# Patient Record
Sex: Male | Born: 1955 | Race: White | Hispanic: No | Smoking: Never smoker
Health system: Southern US, Community
[De-identification: ages and names within clinical notes are randomized; demographics above are authoritative.]

## PROBLEM LIST (undated history)

## (undated) DIAGNOSIS — I2510 Atherosclerotic heart disease of native coronary artery without angina pectoris: Secondary | ICD-10-CM

## (undated) DIAGNOSIS — N189 Chronic kidney disease, unspecified: Secondary | ICD-10-CM

## (undated) DIAGNOSIS — K222 Esophageal obstruction: Secondary | ICD-10-CM

## (undated) DIAGNOSIS — F329 Major depressive disorder, single episode, unspecified: Secondary | ICD-10-CM

## (undated) DIAGNOSIS — F419 Anxiety disorder, unspecified: Secondary | ICD-10-CM

## (undated) DIAGNOSIS — G8220 Paraplegia, unspecified: Secondary | ICD-10-CM

## (undated) DIAGNOSIS — Z8719 Personal history of other diseases of the digestive system: Secondary | ICD-10-CM

## (undated) DIAGNOSIS — F32A Depression, unspecified: Secondary | ICD-10-CM

## (undated) DIAGNOSIS — I219 Acute myocardial infarction, unspecified: Secondary | ICD-10-CM

## (undated) DIAGNOSIS — Z8614 Personal history of Methicillin resistant Staphylococcus aureus infection: Secondary | ICD-10-CM

## (undated) DIAGNOSIS — K219 Gastro-esophageal reflux disease without esophagitis: Secondary | ICD-10-CM

## (undated) DIAGNOSIS — L8990 Pressure ulcer of unspecified site, unspecified stage: Secondary | ICD-10-CM

## (undated) DIAGNOSIS — I10 Essential (primary) hypertension: Secondary | ICD-10-CM

## (undated) DIAGNOSIS — E785 Hyperlipidemia, unspecified: Secondary | ICD-10-CM

## (undated) HISTORY — PX: TOE AMPUTATION: SHX809

## (undated) HISTORY — DX: Gastro-esophageal reflux disease without esophagitis: K21.9

## (undated) HISTORY — DX: Major depressive disorder, single episode, unspecified: F32.9

## (undated) HISTORY — DX: Hyperlipidemia, unspecified: E78.5

## (undated) HISTORY — PX: COLOSTOMY: SHX63

## (undated) HISTORY — DX: Anxiety disorder, unspecified: F41.9

## (undated) HISTORY — DX: Pressure ulcer of unspecified site, unspecified stage: L89.90

## (undated) HISTORY — DX: Paraplegia, unspecified: G82.20

## (undated) HISTORY — DX: Depression, unspecified: F32.A

## (undated) HISTORY — DX: Atherosclerotic heart disease of native coronary artery without angina pectoris: I25.10

## (undated) HISTORY — DX: Personal history of other diseases of the digestive system: Z87.19

## (undated) HISTORY — DX: Personal history of Methicillin resistant Staphylococcus aureus infection: Z86.14

## (undated) HISTORY — DX: Chronic kidney disease, unspecified: N18.9

## (undated) HISTORY — DX: Esophageal obstruction: K22.2

## (undated) HISTORY — PX: ANKLE SURGERY: SHX546

## (undated) HISTORY — PX: EMBOLIZATION: SHX1496

## (undated) HISTORY — DX: Essential (primary) hypertension: I10

## (undated) HISTORY — PX: JOINT REPLACEMENT: SHX530

## (undated) HISTORY — DX: Acute myocardial infarction, unspecified: I21.9

## (undated) HISTORY — PX: OTHER SURGICAL HISTORY: SHX169

---

## 2003-12-11 ENCOUNTER — Emergency Department (HOSPITAL_COMMUNITY): Admission: EM | Admit: 2003-12-11 | Discharge: 2003-12-11 | Payer: Self-pay | Admitting: Emergency Medicine

## 2004-01-13 ENCOUNTER — Other Ambulatory Visit: Payer: Self-pay

## 2004-02-18 ENCOUNTER — Encounter: Payer: Self-pay | Admitting: Internal Medicine

## 2004-03-16 ENCOUNTER — Ambulatory Visit: Payer: Self-pay | Admitting: Internal Medicine

## 2004-03-20 ENCOUNTER — Encounter: Payer: Self-pay | Admitting: Internal Medicine

## 2004-03-20 ENCOUNTER — Ambulatory Visit: Payer: Self-pay | Admitting: Internal Medicine

## 2004-03-20 ENCOUNTER — Ambulatory Visit: Payer: Self-pay | Admitting: Infectious Diseases

## 2004-03-20 ENCOUNTER — Inpatient Hospital Stay (HOSPITAL_COMMUNITY): Admission: AD | Admit: 2004-03-20 | Discharge: 2004-04-18 | Payer: Self-pay | Admitting: Neurological Surgery

## 2004-03-20 ENCOUNTER — Ambulatory Visit: Payer: Self-pay | Admitting: Physical Medicine & Rehabilitation

## 2004-04-19 ENCOUNTER — Encounter: Payer: Self-pay | Admitting: Internal Medicine

## 2004-05-17 ENCOUNTER — Ambulatory Visit (HOSPITAL_COMMUNITY): Admission: RE | Admit: 2004-05-17 | Discharge: 2004-05-17 | Payer: Self-pay | Admitting: Internal Medicine

## 2004-05-20 ENCOUNTER — Encounter: Payer: Self-pay | Admitting: Internal Medicine

## 2004-06-20 ENCOUNTER — Encounter: Payer: Self-pay | Admitting: Internal Medicine

## 2004-07-18 ENCOUNTER — Encounter: Payer: Self-pay | Admitting: Internal Medicine

## 2004-08-18 ENCOUNTER — Encounter: Payer: Self-pay | Admitting: Internal Medicine

## 2004-09-03 ENCOUNTER — Ambulatory Visit (HOSPITAL_COMMUNITY): Admission: RE | Admit: 2004-09-03 | Discharge: 2004-09-04 | Payer: Self-pay | Admitting: Internal Medicine

## 2004-09-17 ENCOUNTER — Encounter: Payer: Self-pay | Admitting: Internal Medicine

## 2004-10-18 ENCOUNTER — Encounter: Payer: Self-pay | Admitting: Internal Medicine

## 2004-11-17 ENCOUNTER — Encounter: Payer: Self-pay | Admitting: Internal Medicine

## 2004-12-18 ENCOUNTER — Encounter: Payer: Self-pay | Admitting: Internal Medicine

## 2005-01-18 ENCOUNTER — Encounter: Payer: Self-pay | Admitting: Internal Medicine

## 2005-02-17 ENCOUNTER — Encounter: Payer: Self-pay | Admitting: Internal Medicine

## 2005-03-20 ENCOUNTER — Encounter: Payer: Self-pay | Admitting: Internal Medicine

## 2005-03-22 ENCOUNTER — Ambulatory Visit: Payer: Self-pay | Admitting: Vascular Surgery

## 2005-04-04 ENCOUNTER — Ambulatory Visit: Payer: Self-pay | Admitting: Specialist

## 2005-04-19 ENCOUNTER — Encounter: Payer: Self-pay | Admitting: Internal Medicine

## 2005-05-20 ENCOUNTER — Encounter: Payer: Self-pay | Admitting: Internal Medicine

## 2005-05-31 ENCOUNTER — Ambulatory Visit: Payer: Self-pay | Admitting: Vascular Surgery

## 2005-06-19 ENCOUNTER — Other Ambulatory Visit: Payer: Self-pay

## 2005-06-20 ENCOUNTER — Encounter: Payer: Self-pay | Admitting: Internal Medicine

## 2005-06-20 ENCOUNTER — Inpatient Hospital Stay: Payer: Self-pay | Admitting: Internal Medicine

## 2005-07-01 ENCOUNTER — Ambulatory Visit: Payer: Self-pay | Admitting: Orthopaedic Surgery

## 2005-07-18 ENCOUNTER — Encounter: Payer: Self-pay | Admitting: Internal Medicine

## 2005-08-18 ENCOUNTER — Encounter: Payer: Self-pay | Admitting: Internal Medicine

## 2005-10-03 ENCOUNTER — Inpatient Hospital Stay: Payer: Self-pay | Admitting: Internal Medicine

## 2005-10-16 ENCOUNTER — Encounter: Payer: Self-pay | Admitting: Internal Medicine

## 2005-10-18 ENCOUNTER — Encounter: Payer: Self-pay | Admitting: Internal Medicine

## 2005-11-04 ENCOUNTER — Ambulatory Visit: Payer: Self-pay | Admitting: Orthopedic Surgery

## 2005-11-11 ENCOUNTER — Ambulatory Visit: Payer: Self-pay | Admitting: Orthopedic Surgery

## 2005-11-17 ENCOUNTER — Encounter: Payer: Self-pay | Admitting: Internal Medicine

## 2005-11-18 ENCOUNTER — Ambulatory Visit: Payer: Self-pay | Admitting: Orthopedic Surgery

## 2005-12-02 ENCOUNTER — Ambulatory Visit: Payer: Self-pay | Admitting: Orthopedic Surgery

## 2005-12-18 ENCOUNTER — Encounter: Payer: Self-pay | Admitting: Internal Medicine

## 2006-01-18 ENCOUNTER — Encounter: Payer: Self-pay | Admitting: Internal Medicine

## 2006-02-17 ENCOUNTER — Encounter: Payer: Self-pay | Admitting: Internal Medicine

## 2006-03-10 ENCOUNTER — Ambulatory Visit: Payer: Self-pay | Admitting: Internal Medicine

## 2006-03-20 ENCOUNTER — Encounter: Payer: Self-pay | Admitting: Internal Medicine

## 2006-07-11 ENCOUNTER — Encounter: Payer: Self-pay | Admitting: Internal Medicine

## 2006-07-19 ENCOUNTER — Inpatient Hospital Stay: Payer: Self-pay | Admitting: Internal Medicine

## 2006-08-27 ENCOUNTER — Encounter: Payer: Self-pay | Admitting: Internal Medicine

## 2007-02-02 ENCOUNTER — Encounter: Payer: Self-pay | Admitting: Internal Medicine

## 2007-02-10 ENCOUNTER — Ambulatory Visit: Payer: Self-pay | Admitting: Pain Medicine

## 2007-02-11 ENCOUNTER — Observation Stay: Payer: Self-pay | Admitting: Internal Medicine

## 2007-02-13 ENCOUNTER — Inpatient Hospital Stay: Payer: Self-pay | Admitting: Internal Medicine

## 2007-02-18 ENCOUNTER — Encounter: Payer: Self-pay | Admitting: Internal Medicine

## 2007-08-18 ENCOUNTER — Ambulatory Visit: Payer: Self-pay | Admitting: Cardiovascular Disease

## 2007-08-18 ENCOUNTER — Inpatient Hospital Stay (HOSPITAL_COMMUNITY): Admission: EM | Admit: 2007-08-18 | Discharge: 2007-09-14 | Payer: Self-pay | Admitting: Emergency Medicine

## 2007-08-18 ENCOUNTER — Ambulatory Visit: Payer: Self-pay | Admitting: Pulmonary Disease

## 2007-08-24 ENCOUNTER — Encounter: Payer: Self-pay | Admitting: Pulmonary Disease

## 2007-08-24 ENCOUNTER — Ambulatory Visit: Payer: Self-pay | Admitting: Vascular Surgery

## 2007-08-25 ENCOUNTER — Encounter: Payer: Self-pay | Admitting: Pulmonary Disease

## 2007-08-31 ENCOUNTER — Ambulatory Visit: Payer: Self-pay | Admitting: Infectious Diseases

## 2007-09-04 ENCOUNTER — Ambulatory Visit: Payer: Self-pay | Admitting: Cardiology

## 2007-09-04 ENCOUNTER — Encounter: Payer: Self-pay | Admitting: Cardiology

## 2007-11-18 ENCOUNTER — Ambulatory Visit: Payer: Self-pay | Admitting: Internal Medicine

## 2007-11-18 ENCOUNTER — Inpatient Hospital Stay (HOSPITAL_COMMUNITY): Admission: EM | Admit: 2007-11-18 | Discharge: 2007-12-03 | Payer: Self-pay | Admitting: Emergency Medicine

## 2007-11-23 ENCOUNTER — Ambulatory Visit: Payer: Self-pay | Admitting: Infectious Diseases

## 2007-11-30 ENCOUNTER — Encounter (INDEPENDENT_AMBULATORY_CARE_PROVIDER_SITE_OTHER): Payer: Self-pay | Admitting: Internal Medicine

## 2007-12-16 ENCOUNTER — Encounter: Admission: RE | Admit: 2007-12-16 | Discharge: 2007-12-16 | Payer: Self-pay | Admitting: Interventional Radiology

## 2007-12-30 ENCOUNTER — Encounter: Admission: RE | Admit: 2007-12-30 | Discharge: 2007-12-30 | Payer: Self-pay | Admitting: Interventional Radiology

## 2008-01-13 ENCOUNTER — Encounter: Admission: RE | Admit: 2008-01-13 | Discharge: 2008-01-13 | Payer: Self-pay | Admitting: Interventional Radiology

## 2008-04-09 ENCOUNTER — Ambulatory Visit: Payer: Self-pay

## 2008-04-20 ENCOUNTER — Ambulatory Visit (HOSPITAL_COMMUNITY): Admission: RE | Admit: 2008-04-20 | Discharge: 2008-04-20 | Payer: Self-pay | Admitting: Internal Medicine

## 2008-04-28 ENCOUNTER — Ambulatory Visit (HOSPITAL_COMMUNITY): Admission: RE | Admit: 2008-04-28 | Discharge: 2008-04-28 | Payer: Self-pay | Admitting: Internal Medicine

## 2008-04-28 ENCOUNTER — Encounter (HOSPITAL_BASED_OUTPATIENT_CLINIC_OR_DEPARTMENT_OTHER): Payer: Self-pay | Admitting: Internal Medicine

## 2008-05-20 HISTORY — PX: CORONARY ARTERY BYPASS GRAFT: SHX141

## 2008-08-05 ENCOUNTER — Ambulatory Visit: Payer: Self-pay | Admitting: Cardiovascular Disease

## 2008-08-06 ENCOUNTER — Inpatient Hospital Stay (HOSPITAL_COMMUNITY): Admission: EM | Admit: 2008-08-06 | Discharge: 2008-09-01 | Payer: Self-pay | Admitting: Emergency Medicine

## 2008-08-06 ENCOUNTER — Ambulatory Visit: Payer: Self-pay | Admitting: Pulmonary Disease

## 2008-08-06 ENCOUNTER — Ambulatory Visit: Payer: Self-pay | Admitting: Internal Medicine

## 2008-08-07 ENCOUNTER — Ambulatory Visit: Payer: Self-pay | Admitting: Internal Medicine

## 2008-08-08 ENCOUNTER — Encounter: Payer: Self-pay | Admitting: Internal Medicine

## 2008-08-09 ENCOUNTER — Encounter (INDEPENDENT_AMBULATORY_CARE_PROVIDER_SITE_OTHER): Payer: Self-pay | Admitting: Internal Medicine

## 2008-08-11 ENCOUNTER — Ambulatory Visit: Payer: Self-pay | Admitting: Vascular Surgery

## 2008-08-14 ENCOUNTER — Encounter (INDEPENDENT_AMBULATORY_CARE_PROVIDER_SITE_OTHER): Payer: Self-pay | Admitting: Internal Medicine

## 2008-09-27 ENCOUNTER — Ambulatory Visit: Payer: Self-pay | Admitting: Vascular Surgery

## 2008-12-14 ENCOUNTER — Encounter: Payer: Self-pay | Admitting: Cardiology

## 2008-12-14 ENCOUNTER — Encounter (INDEPENDENT_AMBULATORY_CARE_PROVIDER_SITE_OTHER): Payer: Self-pay | Admitting: *Deleted

## 2009-01-17 ENCOUNTER — Ambulatory Visit: Payer: Self-pay | Admitting: Cardiology

## 2009-01-17 DIAGNOSIS — G8220 Paraplegia, unspecified: Secondary | ICD-10-CM

## 2009-01-17 DIAGNOSIS — I2510 Atherosclerotic heart disease of native coronary artery without angina pectoris: Secondary | ICD-10-CM | POA: Insufficient documentation

## 2009-01-17 DIAGNOSIS — I10 Essential (primary) hypertension: Secondary | ICD-10-CM | POA: Insufficient documentation

## 2009-01-18 ENCOUNTER — Telehealth: Payer: Self-pay | Admitting: Cardiology

## 2009-01-19 ENCOUNTER — Telehealth: Payer: Self-pay | Admitting: Cardiology

## 2009-01-30 ENCOUNTER — Telehealth: Payer: Self-pay | Admitting: Cardiology

## 2009-01-31 ENCOUNTER — Encounter: Payer: Self-pay | Admitting: Cardiology

## 2009-03-02 ENCOUNTER — Telehealth: Payer: Self-pay | Admitting: Cardiology

## 2009-03-15 ENCOUNTER — Encounter: Payer: Self-pay | Admitting: Cardiology

## 2009-03-20 ENCOUNTER — Telehealth: Payer: Self-pay | Admitting: Cardiology

## 2009-03-29 ENCOUNTER — Telehealth: Payer: Self-pay | Admitting: Cardiology

## 2009-03-29 ENCOUNTER — Telehealth (INDEPENDENT_AMBULATORY_CARE_PROVIDER_SITE_OTHER): Payer: Self-pay | Admitting: *Deleted

## 2009-07-20 ENCOUNTER — Telehealth (INDEPENDENT_AMBULATORY_CARE_PROVIDER_SITE_OTHER): Payer: Self-pay | Admitting: *Deleted

## 2009-08-28 ENCOUNTER — Emergency Department (HOSPITAL_COMMUNITY): Admission: EM | Admit: 2009-08-28 | Discharge: 2009-08-28 | Payer: Self-pay | Admitting: Emergency Medicine

## 2009-09-01 ENCOUNTER — Emergency Department (HOSPITAL_COMMUNITY): Admission: EM | Admit: 2009-09-01 | Discharge: 2009-09-01 | Payer: Self-pay | Admitting: Emergency Medicine

## 2009-09-02 ENCOUNTER — Emergency Department (HOSPITAL_COMMUNITY): Admission: EM | Admit: 2009-09-02 | Discharge: 2009-09-02 | Payer: Self-pay | Admitting: Emergency Medicine

## 2009-09-13 ENCOUNTER — Encounter (HOSPITAL_BASED_OUTPATIENT_CLINIC_OR_DEPARTMENT_OTHER): Payer: Self-pay | Admitting: Internal Medicine

## 2009-09-13 ENCOUNTER — Ambulatory Visit (HOSPITAL_COMMUNITY): Admission: RE | Admit: 2009-09-13 | Discharge: 2009-09-13 | Payer: Self-pay | Admitting: Internal Medicine

## 2009-09-13 ENCOUNTER — Ambulatory Visit: Payer: Self-pay | Admitting: Cardiovascular Disease

## 2009-11-23 ENCOUNTER — Telehealth: Payer: Self-pay | Admitting: Cardiology

## 2009-12-19 ENCOUNTER — Telehealth: Payer: Self-pay | Admitting: Cardiology

## 2009-12-21 ENCOUNTER — Encounter: Payer: Self-pay | Admitting: Cardiology

## 2010-01-02 ENCOUNTER — Ambulatory Visit: Payer: Self-pay | Admitting: Cardiology

## 2010-01-15 ENCOUNTER — Ambulatory Visit: Payer: Self-pay | Admitting: Internal Medicine

## 2010-01-15 ENCOUNTER — Ambulatory Visit: Payer: Self-pay | Admitting: Pulmonary Disease

## 2010-01-15 ENCOUNTER — Inpatient Hospital Stay (HOSPITAL_COMMUNITY): Admission: EM | Admit: 2010-01-15 | Discharge: 2010-01-26 | Payer: Self-pay | Admitting: Emergency Medicine

## 2010-03-29 ENCOUNTER — Telehealth: Payer: Self-pay | Admitting: Cardiology

## 2010-04-15 IMAGING — US US RENAL
1 series · 14 of 15 positions shown · non-contrast
Comparison: CT 01/13/2008.

CLINICAL DATA: Rule out obstruction.  Elevated creatinine.

RENAL/URINARY TRACT ULTRASOUND COMPLETE

[Series 1: us renal · 0.37mm/px · 14 of 15 slices shown]
[im 1/15]
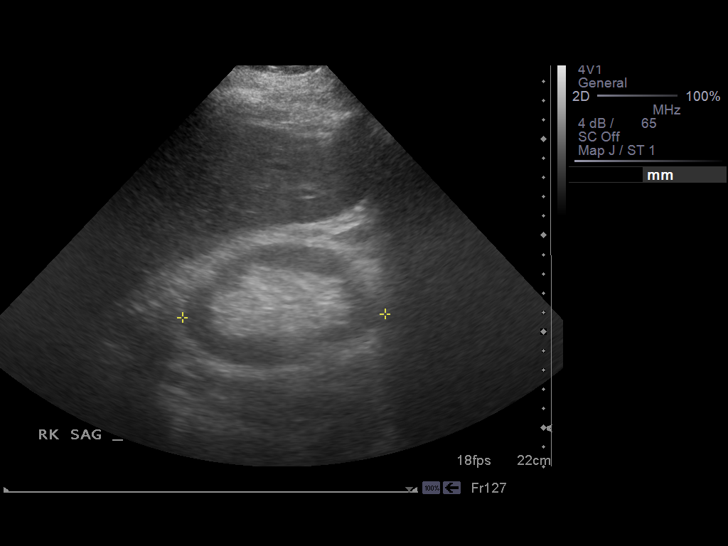
[im 2/15]
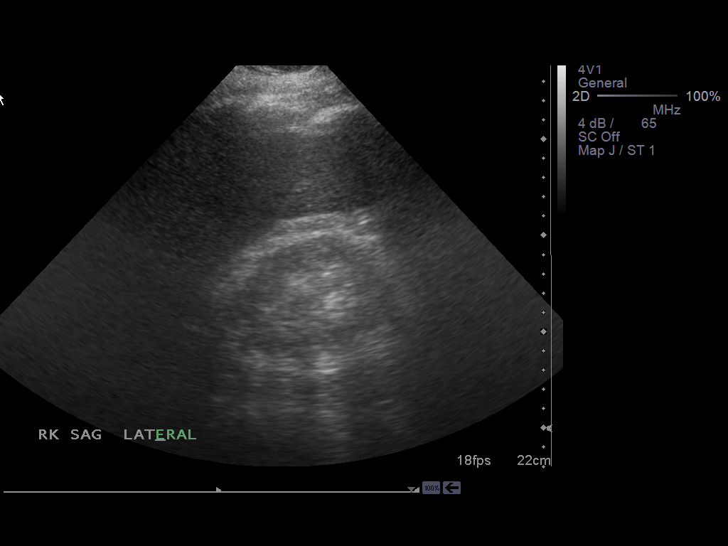
[im 3/15]
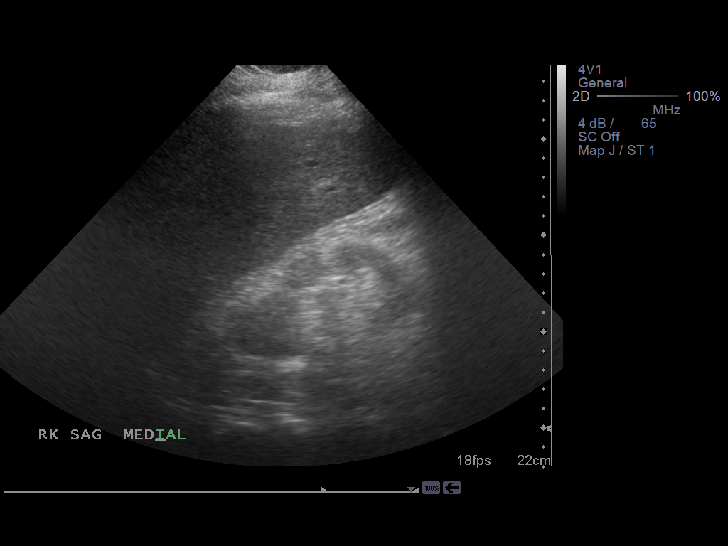
[im 4/15]
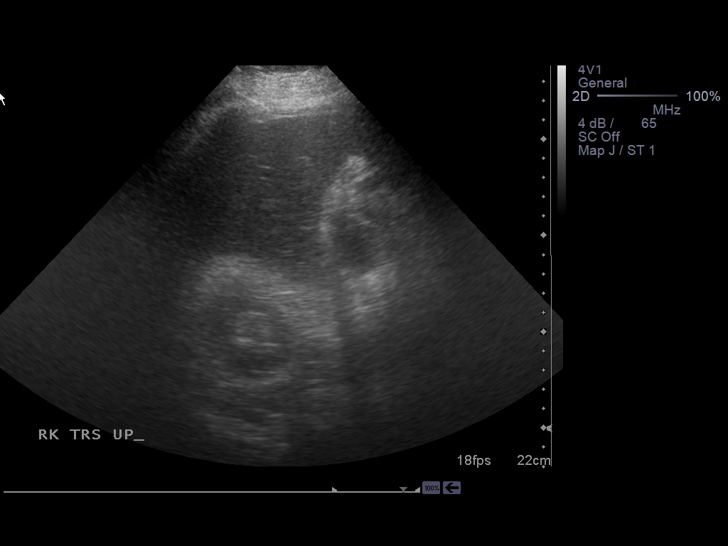
[im 5/15]
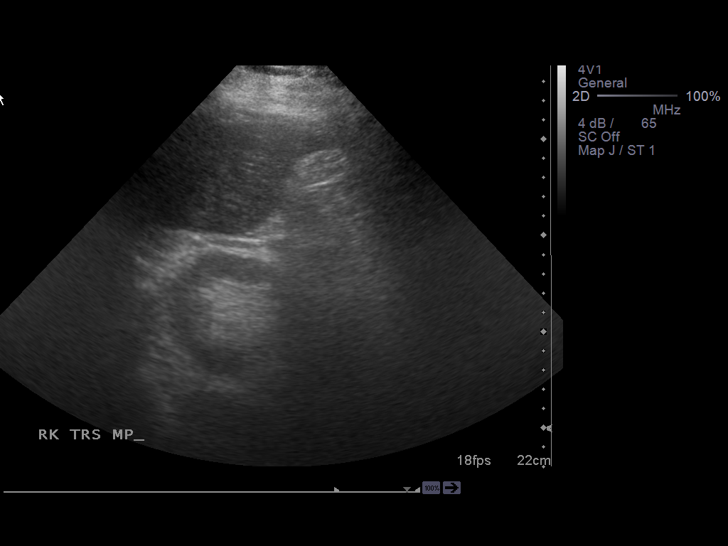
[im 6/15]
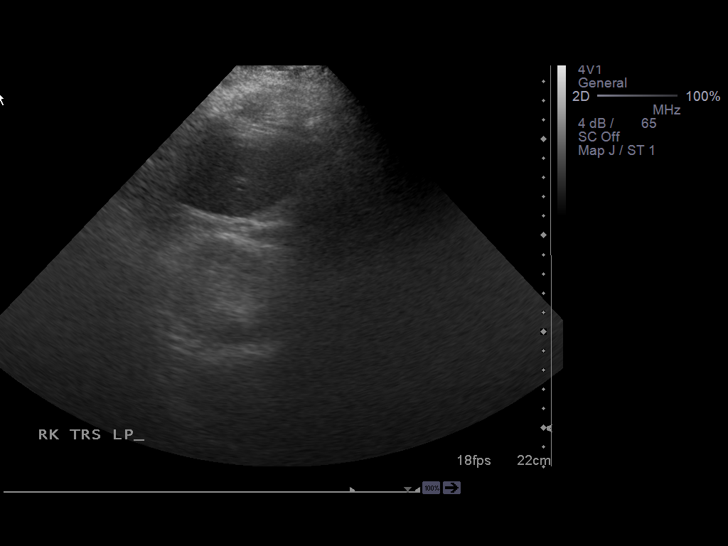
[im 7/15]
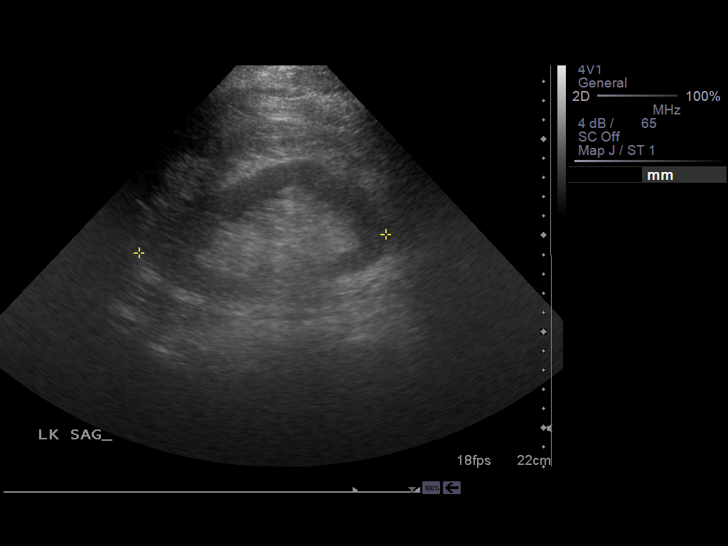
[im 9/15]
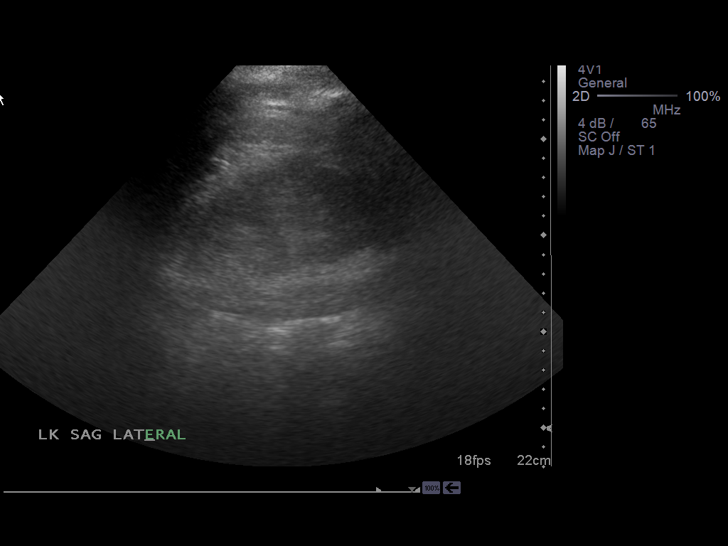
[im 10/15]
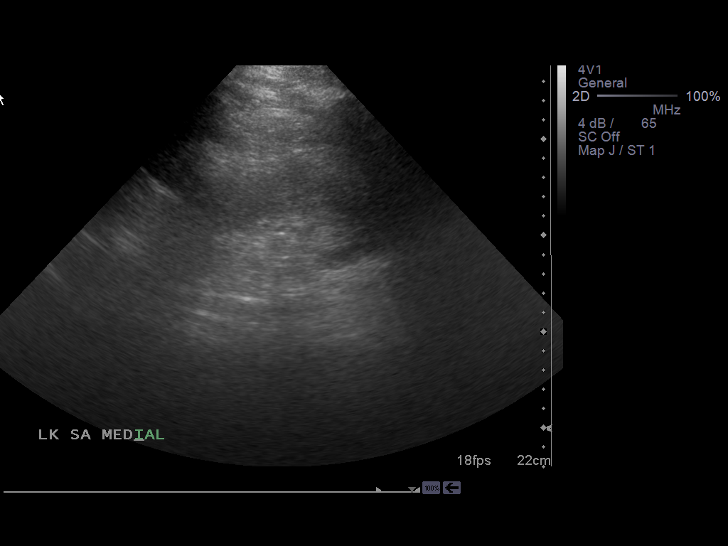
[im 11/15]
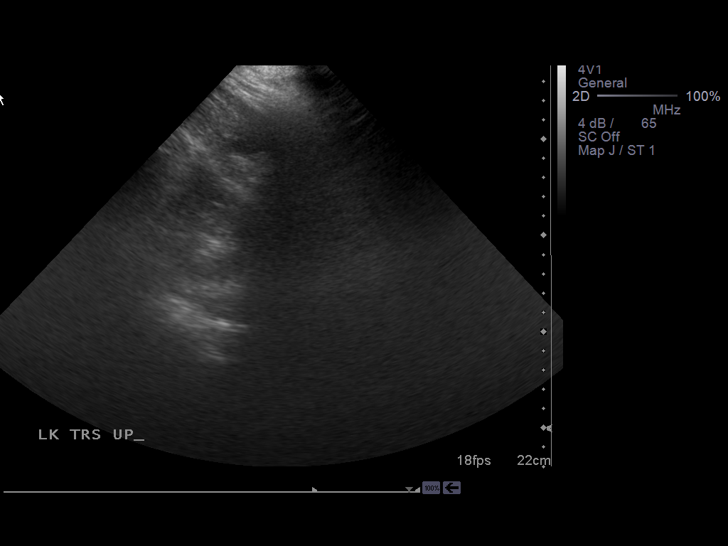
[im 12/15]
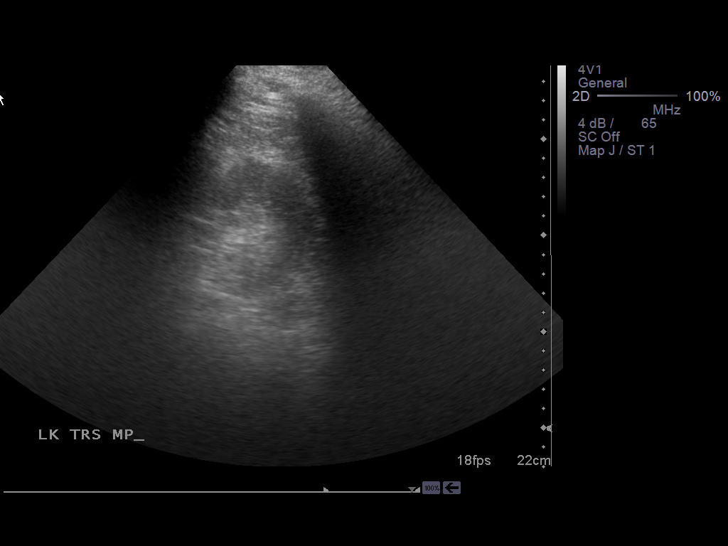
[im 13/15]
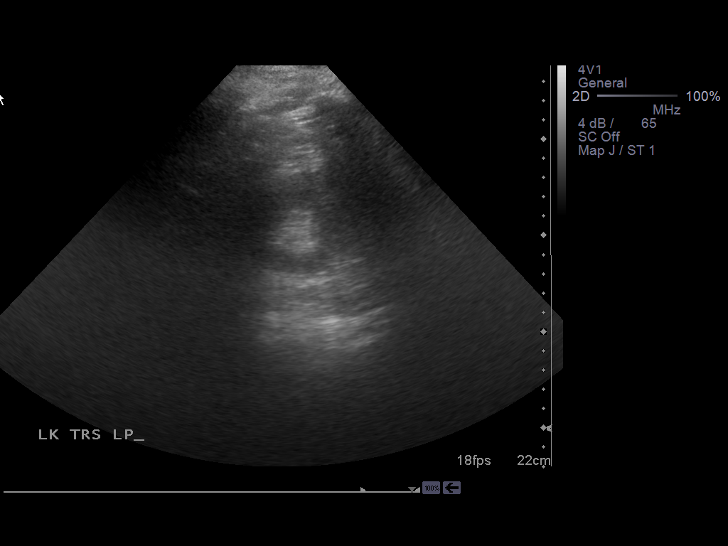
[im 14/15]
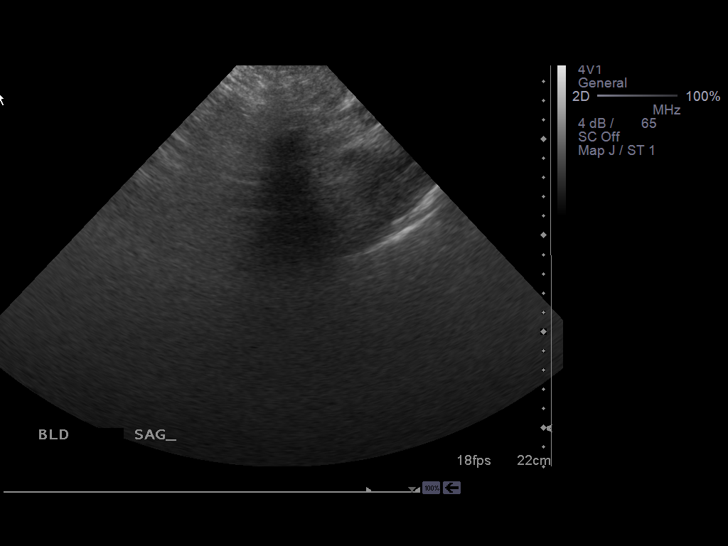
[im 15/15]
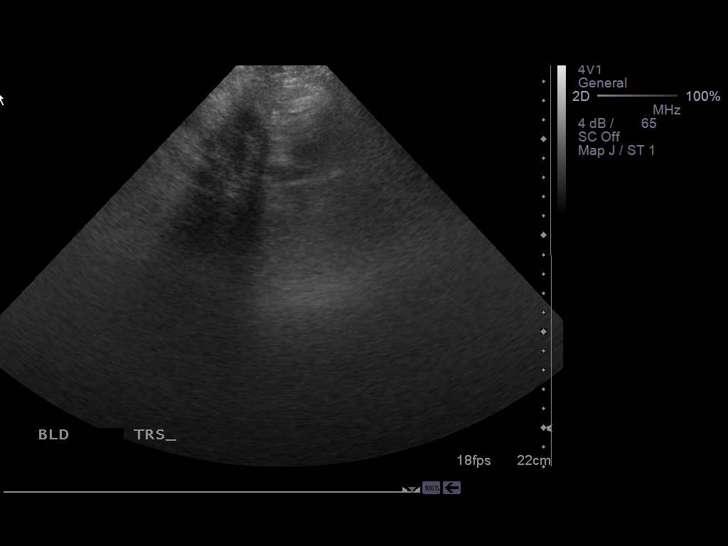

[14 of 15 positions shown; findings below may reference images not displayed]

FINDINGS: Right Kidney:  10.5 cm.  There is no obstruction or mass.  Renal
cortex is normal.

Left Kidney:  12.8 cm.  There is no obstruction or mass.

Bladder:  Empty and not evaluated.
IMPRESSION: Negative for hydronephrosis.

## 2010-04-16 IMAGING — CR DG CHEST 1V PORT
1 series · 1 of 1 positions shown · non-contrast
Comparison: Portable chest x-rays yesterday and 08/15/2008.

CLINICAL DATA: Ventilator dependent respiratory failure.  Follow up
basilar atelectasis.

PORTABLE CHEST - 1 VIEW [DATE]/[id number] hours:

[AP]
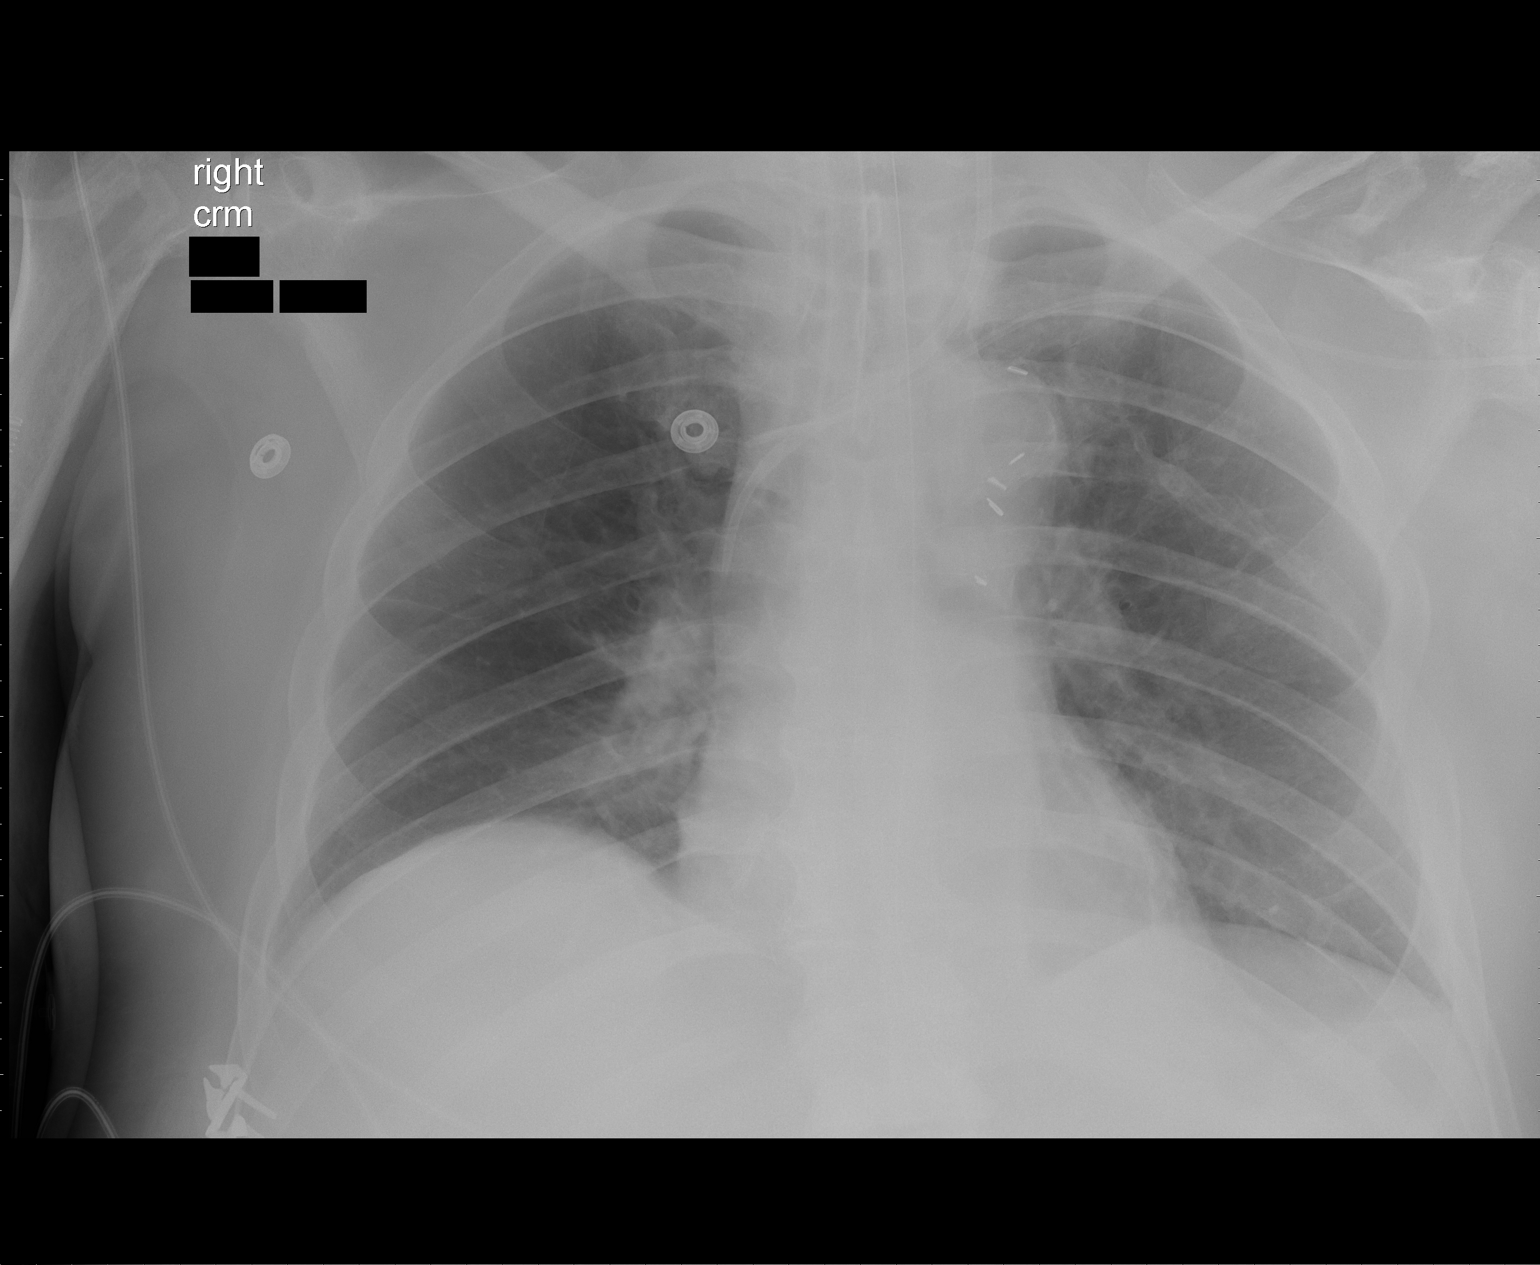

[1 of 1 positions shown; findings below may reference images not displayed]

FINDINGS: Endotracheal tube tip in satisfactory position
approximately 3 cm above the carina.  Left jugular central venous
catheter tip in the upper SVC.  Left subclavian central venous
catheter tip at the junction of the innominate vein and SVC.  Heart
size normal and stable.  Significant improvement in aeration in
both lung bases, with only minimal left basilar atelectasis
persisting.  Small left pleural effusion persists, also improved.
No new abnormalities.
IMPRESSION: Support apparatus satisfactory.  Improved aeration in the lung
bases, with only minimal left basilar atelectasis persisting.
Improved left pleural effusion, with only a small effusion
persisting.  No new abnormalities.

## 2010-04-17 IMAGING — CR DG CHEST 1V PORT
1 series · 1 of 1 positions shown · non-contrast
Comparison: Chest 08/17/2008.

CLINICAL DATA: Unstable angina.  Respiratory distress.

PORTABLE CHEST - 1 VIEW

[AP]
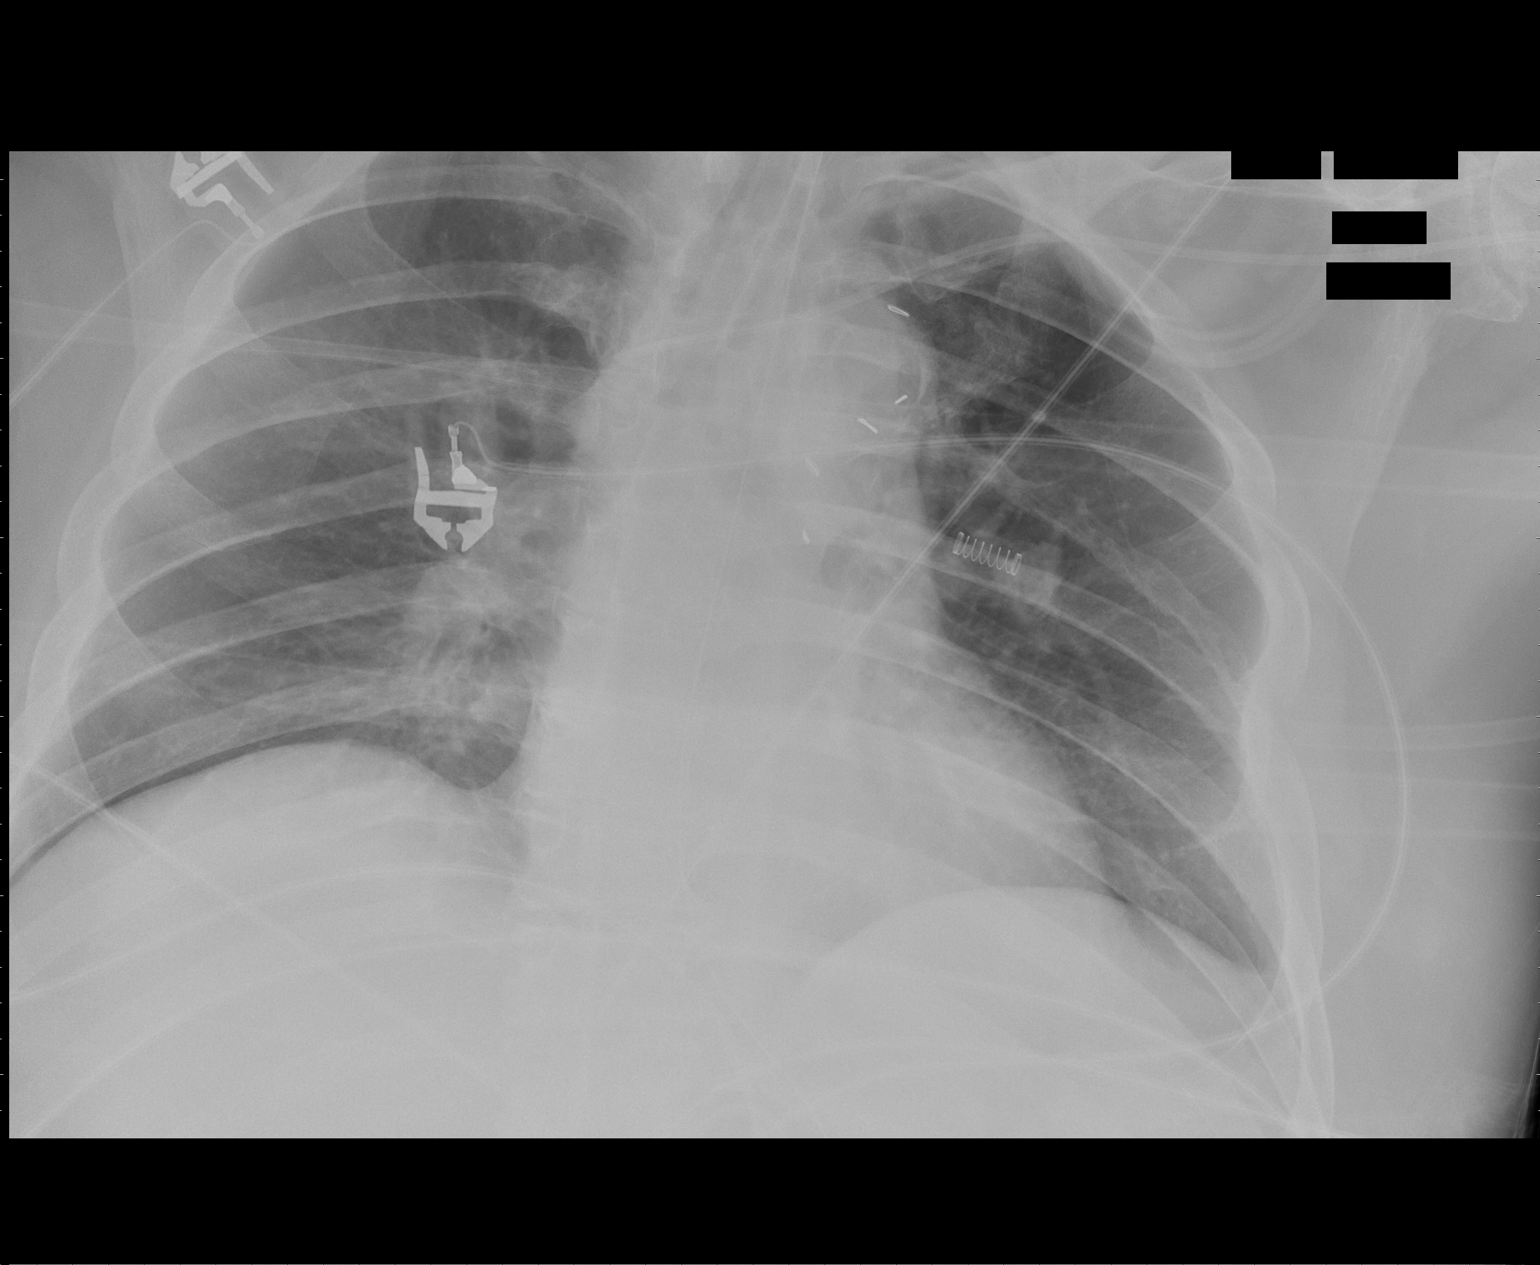

[1 of 1 positions shown; findings below may reference images not displayed]

FINDINGS: Support apparatus is unchanged.  Note is made that the
patient's endotracheal tube is 2.5 cm above the carina.  There is a
small left effusion.  Mild bibasilar atelectasis noted.  Heart size
normal.
IMPRESSION: 1.  Support apparatus as described.
2.  Very small left pleural effusion and mild bibasilar
atelectasis.

## 2010-04-26 ENCOUNTER — Emergency Department (HOSPITAL_COMMUNITY): Admission: EM | Admit: 2010-04-26 | Discharge: 2009-11-04 | Payer: Self-pay | Admitting: Emergency Medicine

## 2010-06-11 ENCOUNTER — Encounter: Payer: Self-pay | Admitting: Interventional Radiology

## 2010-06-20 ENCOUNTER — Encounter: Payer: Self-pay | Admitting: Nurse Practitioner

## 2010-06-20 NOTE — Miscellaneous (Signed)
Summary: [facility] Transfer/Discharge   [facility] Transfer/Discharge   Imported By: Roderic Ovens 01/05/2010 10:25:18  _____________________________________________________________________  External Attachment:    Type:   Image     Comment:   External Document

## 2010-06-20 NOTE — Progress Notes (Signed)
Summary: pt need to talk about tranportation letter  Phone Note Call from Patient Call back at Home Phone [phone]   Caller: Lizbeth Bark Reason for Call: Talk to Nurse, Talk to Doctor Summary of Call: the letter that Dr. Juanda Chance to the insurance company regarding her son having to be transported by ems to an appt did not work and she wants to talk to someone to see if something else could be done Initial call taken by: Omer Jack,  March 29, 2010 9:11 AM  Follow-up for Phone Call        Spoke with pt's mother who reports Medicare will not cover son's transport by EMS to doctor's office visit. Dr. Juanda Chance has written letter regarding this (March 15, 2009). Daughter is asking if Dr. Juanda Chance can write another letter or there is anything else we can do to assist with getting this covered. I told daughter I would have Dr. Regino Schultze nurse call her when back in office tomorrow. Dossie Arbour, RN, BSN  March 29, 2010 10:01 AM  I spoke with the pt's mom. I explained I do not know that there is anything more that we can submit. She has gotten the pt's medical records, a letter from Dr. Juanda Chance, she has met with a judge and appealed to Lorene Dy in regards to EMS transport being paid. She states she does not know of anything either. She may try to repuest from the EMS transport service an adjustment in payment. Follow-up by: Sherri Rad, RN, BSN,  March 30, 2010 5:31 PM

## 2010-06-20 NOTE — Progress Notes (Signed)
Summary: mom has questions  Phone Note Call from Patient   Caller: Mom Reason for Call: Talk to Nurse Summary of Call: pt's mom sadie calling having questions pls call [phone] Initial call taken by: Glynda Jaeger,  November 23, 2009 3:37 PM  Follow-up for Phone Call        I talked with Mr. Arduini mom Sadie.  She says he has been to the ED at [facility] 3 times since April with ? blood pressure issues.  His b/p on 6/18 at the ED was 152/83.  She is concerned about medications he is on at the nursing home and would like her son to be seen earlier than Sept.  Appt made for 8/16 at 1415.   Lisabeth Devoid RN

## 2010-06-20 NOTE — Progress Notes (Signed)
  Recieved pw on my desk for this patient, ( Request for Amendment of Healht Information) forwarded back to [facility]..Was inter officed to her Erle Crocker  July 20, 2009 1:07 PM
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
## 2010-06-20 NOTE — Progress Notes (Signed)
Summary: list of meds  Phone Note Call from Patient Call back at Home Phone [phone]   Caller: Mom [phone] Reason for Call: Talk to Nurse Details for Reason: has question concerning appt. should mom faxed over a list of meds. Initial call taken by: Lorne Skeens,  December 19, 2009 4:35 PM  Follow-up for Phone Call        I spoke with the pt's mother. She will bring an updated med list when she comes with the pt on 8/16. Follow-up by: Sherri Rad, RN, BSN,  December 19, 2009 5:21 PM

## 2010-06-26 ENCOUNTER — Encounter: Payer: Self-pay | Admitting: Nurse Practitioner

## 2010-06-29 ENCOUNTER — Encounter (INDEPENDENT_AMBULATORY_CARE_PROVIDER_SITE_OTHER): Payer: Self-pay | Admitting: *Deleted

## 2010-07-04 ENCOUNTER — Telehealth: Payer: Self-pay | Admitting: Internal Medicine

## 2010-07-05 NOTE — Letter (Signed)
Summary: New Patient letter  [facility]  [address] [place], [place] [address]   Phone: [phone]  Fax: [phone]       06/29/2010 MRN: [id number]  [facility] [address] [place], [place]  [address]  [place]  Dear Mr. PRATS,  Welcome to the Gastroenterology Division at [facility].    You are scheduled to see Dr.  Melvia Heaps on July 06, 2010 at 9:00am on the 3rd floor at [facility], [address]  We ask that you try to arrive at our office 15 minutes prior to your appointment time to allow for check-in.  We would like you to complete the enclosed self-administered evaluation form prior to your visit and bring it with you on the day of your appointment.  We will review it with you.  Also, please bring a complete list of all your medications or, if you prefer, bring the medication bottles and we will list them.  Please bring your insurance card so that we may make a copy of it.  If your insurance requires a referral to see a specialist, please bring your referral form from your primary care physician.  Co-payments are due at the time of your visit and may be paid by cash, check or credit card.     Your office visit will consist of a consult with your physician (includes a physical exam), any laboratory testing he/she may order, scheduling of any necessary diagnostic testing (e.g. x-ray, ultrasound, CT-scan), and scheduling of a procedure (e.g. Endoscopy, Colonoscopy) if required.  Please allow enough time on your schedule to allow for any/all of these possibilities.    If you cannot keep your appointment, please call [phone] to cancel or reschedule prior to your appointment date.  This allows [place] the opportunity to schedule an appointment for another patient in need of care.  If you do not cancel or reschedule by 5 p.m. the business day prior to your appointment date, you will be charged a $50.00 late cancellation/no-show fee.    Thank  you for choosing [facility] for your medical needs.  We appreciate the opportunity to care for you.  Please visit [place] at our website  to learn more about our practice.                     Sincerely,                                                             The Gastroenterology Division

## 2010-07-06 ENCOUNTER — Ambulatory Visit (INDEPENDENT_AMBULATORY_CARE_PROVIDER_SITE_OTHER): Payer: Medicare Other | Admitting: Nurse Practitioner

## 2010-07-06 ENCOUNTER — Other Ambulatory Visit: Payer: Self-pay | Admitting: Internal Medicine

## 2010-07-06 ENCOUNTER — Encounter: Payer: Self-pay | Admitting: Nurse Practitioner

## 2010-07-06 ENCOUNTER — Ambulatory Visit: Payer: Self-pay | Admitting: Gastroenterology

## 2010-07-06 DIAGNOSIS — R112 Nausea with vomiting, unspecified: Secondary | ICD-10-CM

## 2010-07-06 DIAGNOSIS — I2510 Atherosclerotic heart disease of native coronary artery without angina pectoris: Secondary | ICD-10-CM

## 2010-07-06 DIAGNOSIS — K922 Gastrointestinal hemorrhage, unspecified: Secondary | ICD-10-CM

## 2010-07-09 ENCOUNTER — Other Ambulatory Visit: Payer: Self-pay | Admitting: Internal Medicine

## 2010-07-09 ENCOUNTER — Ambulatory Visit (HOSPITAL_COMMUNITY)
Admission: RE | Admit: 2010-07-09 | Discharge: 2010-07-09 | Disposition: A | Discharge status: Home / Self Care | Payer: Medicare Other | Source: Ambulatory Visit | Attending: Internal Medicine | Admitting: Internal Medicine

## 2010-07-09 DIAGNOSIS — R112 Nausea with vomiting, unspecified: Secondary | ICD-10-CM | POA: Insufficient documentation

## 2010-07-10 ENCOUNTER — Telehealth (INDEPENDENT_AMBULATORY_CARE_PROVIDER_SITE_OTHER): Payer: Self-pay | Admitting: *Deleted

## 2010-07-11 NOTE — Progress Notes (Signed)
Summary: triage Nausea and Vomiting  Phone Note From Other Clinic Call back at [phone]   Caller: Ma Rings  Call For: Dr Marina Goodell Reason for Call: Schedule Patient Appt Summary of Call: [name] wants this patient seen asap but Dr Marina Goodell has no available appts needs to be seen for severe nausea and vomitting Initial call taken by: Tawni Levy,  July 04, 2010 4:49 PM  Follow-up for Phone Call        Patient scheduled to see [name], RNP 07/06/10 @ 9am.  Follow-up by: Hilarie Fredrickson MD,  July 05, 2010 9:07 AM
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
## 2010-07-17 NOTE — Progress Notes (Signed)
----   Converted from flag ---- ---- 07/10/2010 11:54 AM, [name] NP wrote: Rene Kocher, will you please let patient know results of UGI series and forward a copy of the results with our attached recommendations to PCP so they can refer him to ENT. Thanks ------------------------------  Phone Note Outgoing Call Call back at Home Phone [phone]   Call placed by: Jesse Fall, RN Summary of Call: Spoke with Elenora Fender patient's mother. Patient is in a nursing home. Explained to her that the results of barium swallow indicate a need to see an ENT. Told her we will fax the results and recommendations to Dr. Leanord Hawking. She gave me the patient's cell number- [phone]. Called and left a message for patient to call me. Faxed results and comments to Dr. Leanord Hawking. Initial call taken by: Jesse Fall RN,  July 10, 2010 4:10 PM  Follow-up for Phone Call        Spoke with patient and gave him the report as per Dr. Memory Dance, RNP. He understands that Dr. Leanord Hawking would be the MD that would refer him to ENT. Follow-up by: Jesse Fall RN,  July 11, 2010 2:16 PM

## 2010-08-02 LAB — BLOOD GAS, ARTERIAL
Acid-base deficit: 2.2 mmol/L — ABNORMAL HIGH (ref 0.0–2.0)
Drawn by: 129711
Drawn by: 270221
O2 Content: 1 L/min
O2 Saturation: 90.8 %
O2 Saturation: 96 %
Patient temperature: 98.6
Patient temperature: 98.6
TCO2: 22.4 mmol/L (ref 0–100)
pO2, Arterial: 88.5 mmHg (ref 80.0–100.0)

## 2010-08-02 LAB — POCT I-STAT 3, ART BLOOD GAS (G3+)
Acid-base deficit: 6 mmol/L — ABNORMAL HIGH (ref 0.0–2.0)
Bicarbonate: 21.1 mEq/L (ref 20.0–24.0)
Bicarbonate: 22.4 mEq/L (ref 20.0–24.0)
O2 Saturation: 92 %
TCO2: 22 mmol/L (ref 0–100)
TCO2: 24 mmol/L (ref 0–100)
pCO2 arterial: 45.6 mmHg — ABNORMAL HIGH (ref 35.0–45.0)
pCO2 arterial: 55.1 mmHg — ABNORMAL HIGH (ref 35.0–45.0)
pH, Arterial: 7.268 — ABNORMAL LOW (ref 7.350–7.450)
pO2, Arterial: 75 mmHg — ABNORMAL LOW (ref 80.0–100.0)

## 2010-08-02 LAB — CBC
HCT: 26.1 % — ABNORMAL LOW (ref 39.0–52.0)
HCT: 26.9 % — ABNORMAL LOW (ref 39.0–52.0)
HCT: 32.6 % — ABNORMAL LOW (ref 39.0–52.0)
HCT: 34.9 % — ABNORMAL LOW (ref 39.0–52.0)
HCT: 38.3 % — ABNORMAL LOW (ref 39.0–52.0)
Hemoglobin: 10.4 g/dL — ABNORMAL LOW (ref 13.0–17.0)
Hemoglobin: 10.5 g/dL — ABNORMAL LOW (ref 13.0–17.0)
Hemoglobin: 11.6 g/dL — ABNORMAL LOW (ref 13.0–17.0)
Hemoglobin: 11.8 g/dL — ABNORMAL LOW (ref 13.0–17.0)
Hemoglobin: 12.7 g/dL — ABNORMAL LOW (ref 13.0–17.0)
Hemoglobin: 8.5 g/dL — ABNORMAL LOW (ref 13.0–17.0)
Hemoglobin: 8.7 g/dL — ABNORMAL LOW (ref 13.0–17.0)
Hemoglobin: 9 g/dL — ABNORMAL LOW (ref 13.0–17.0)
MCH: 26.6 pg (ref 26.0–34.0)
MCH: 26.9 pg (ref 26.0–34.0)
MCH: 27.2 pg (ref 26.0–34.0)
MCHC: 29.8 g/dL — ABNORMAL LOW (ref 30.0–36.0)
MCHC: 30.3 g/dL (ref 30.0–36.0)
MCHC: 32 g/dL (ref 30.0–36.0)
MCHC: 32.2 g/dL (ref 30.0–36.0)
MCHC: 32.3 g/dL (ref 30.0–36.0)
MCHC: 32.4 g/dL (ref 30.0–36.0)
MCHC: 32.6 g/dL (ref 30.0–36.0)
MCV: 81.7 fL (ref 78.0–100.0)
MCV: 82.8 fL (ref 78.0–100.0)
MCV: 83.4 fL (ref 78.0–100.0)
MCV: 83.7 fL (ref 78.0–100.0)
MCV: 90.2 fL (ref 78.0–100.0)
Platelets: 333 10*3/uL (ref 150–400)
Platelets: 423 10*3/uL — ABNORMAL HIGH (ref 150–400)
Platelets: 429 10*3/uL — ABNORMAL HIGH (ref 150–400)
Platelets: 430 10*3/uL — ABNORMAL HIGH (ref 150–400)
RBC: 3.12 MIL/uL — ABNORMAL LOW (ref 4.22–5.81)
RBC: 3.39 MIL/uL — ABNORMAL LOW (ref 4.22–5.81)
RBC: 3.99 MIL/uL — ABNORMAL LOW (ref 4.22–5.81)
RBC: 4.4 MIL/uL (ref 4.22–5.81)
RDW: 15 % (ref 11.5–15.5)
RDW: 15.3 % (ref 11.5–15.5)
RDW: 15.3 % (ref 11.5–15.5)
RDW: 15.4 % (ref 11.5–15.5)
RDW: 16.2 % — ABNORMAL HIGH (ref 11.5–15.5)
WBC: 12.8 10*3/uL — ABNORMAL HIGH (ref 4.0–10.5)
WBC: 13.1 10*3/uL — ABNORMAL HIGH (ref 4.0–10.5)
WBC: 13.7 10*3/uL — ABNORMAL HIGH (ref 4.0–10.5)
WBC: 7.3 10*3/uL (ref 4.0–10.5)
WBC: 8.4 10*3/uL (ref 4.0–10.5)

## 2010-08-02 LAB — GLUCOSE, CAPILLARY
Glucose-Capillary: 137 mg/dL — ABNORMAL HIGH (ref 70–99)
Glucose-Capillary: 140 mg/dL — ABNORMAL HIGH (ref 70–99)
Glucose-Capillary: 141 mg/dL — ABNORMAL HIGH (ref 70–99)
Glucose-Capillary: 146 mg/dL — ABNORMAL HIGH (ref 70–99)
Glucose-Capillary: 146 mg/dL — ABNORMAL HIGH (ref 70–99)
Glucose-Capillary: 155 mg/dL — ABNORMAL HIGH (ref 70–99)
Glucose-Capillary: 156 mg/dL — ABNORMAL HIGH (ref 70–99)
Glucose-Capillary: 159 mg/dL — ABNORMAL HIGH (ref 70–99)
Glucose-Capillary: 160 mg/dL — ABNORMAL HIGH (ref 70–99)
Glucose-Capillary: 167 mg/dL — ABNORMAL HIGH (ref 70–99)
Glucose-Capillary: 169 mg/dL — ABNORMAL HIGH (ref 70–99)
Glucose-Capillary: 184 mg/dL — ABNORMAL HIGH (ref 70–99)
Glucose-Capillary: 188 mg/dL — ABNORMAL HIGH (ref 70–99)
Glucose-Capillary: 202 mg/dL — ABNORMAL HIGH (ref 70–99)
Glucose-Capillary: 213 mg/dL — ABNORMAL HIGH (ref 70–99)
Glucose-Capillary: 235 mg/dL — ABNORMAL HIGH (ref 70–99)
Glucose-Capillary: 250 mg/dL — ABNORMAL HIGH (ref 70–99)
Glucose-Capillary: 298 mg/dL — ABNORMAL HIGH (ref 70–99)

## 2010-08-02 LAB — BASIC METABOLIC PANEL
BUN: 12 mg/dL (ref 6–23)
BUN: 13 mg/dL (ref 6–23)
BUN: 14 mg/dL (ref 6–23)
BUN: 15 mg/dL (ref 6–23)
BUN: 24 mg/dL — ABNORMAL HIGH (ref 6–23)
BUN: 27 mg/dL — ABNORMAL HIGH (ref 6–23)
BUN: 36 mg/dL — ABNORMAL HIGH (ref 6–23)
BUN: 49 mg/dL — ABNORMAL HIGH (ref 6–23)
BUN: 67 mg/dL — ABNORMAL HIGH (ref 6–23)
CO2: 17 mEq/L — ABNORMAL LOW (ref 19–32)
CO2: 18 mEq/L — ABNORMAL LOW (ref 19–32)
CO2: 20 mEq/L (ref 19–32)
CO2: 20 mEq/L (ref 19–32)
CO2: 20 mEq/L (ref 19–32)
CO2: 23 mEq/L (ref 19–32)
Calcium: 8.2 mg/dL — ABNORMAL LOW (ref 8.4–10.5)
Calcium: 8.3 mg/dL — ABNORMAL LOW (ref 8.4–10.5)
Calcium: 8.4 mg/dL (ref 8.4–10.5)
Calcium: 8.6 mg/dL (ref 8.4–10.5)
Calcium: 9.1 mg/dL (ref 8.4–10.5)
Chloride: 112 mEq/L (ref 96–112)
Chloride: 112 mEq/L (ref 96–112)
Chloride: 112 mEq/L (ref 96–112)
Chloride: 113 mEq/L — ABNORMAL HIGH (ref 96–112)
Chloride: 113 mEq/L — ABNORMAL HIGH (ref 96–112)
Chloride: 114 mEq/L — ABNORMAL HIGH (ref 96–112)
Chloride: 115 mEq/L — ABNORMAL HIGH (ref 96–112)
Chloride: 116 mEq/L — ABNORMAL HIGH (ref 96–112)
Creatinine, Ser: 1.09 mg/dL (ref 0.4–1.5)
Creatinine, Ser: 1.24 mg/dL (ref 0.4–1.5)
Creatinine, Ser: 1.31 mg/dL (ref 0.4–1.5)
Creatinine, Ser: 1.44 mg/dL (ref 0.4–1.5)
Creatinine, Ser: 1.81 mg/dL — ABNORMAL HIGH (ref 0.4–1.5)
Creatinine, Ser: 2.36 mg/dL — ABNORMAL HIGH (ref 0.4–1.5)
GFR calc Af Amer: 41 mL/min — ABNORMAL LOW (ref >60)
GFR calc Af Amer: >60 mL/min (ref >60)
GFR calc Af Amer: >60 mL/min (ref >60)
GFR calc Af Amer: >60 mL/min (ref >60)
GFR calc Af Amer: >60 mL/min (ref >60)
GFR calc non Af Amer: 27 mL/min — ABNORMAL LOW (ref >60)
GFR calc non Af Amer: 29 mL/min — ABNORMAL LOW (ref >60)
GFR calc non Af Amer: 53 mL/min — ABNORMAL LOW (ref >60)
GFR calc non Af Amer: 53 mL/min — ABNORMAL LOW (ref >60)
GFR calc non Af Amer: >60 mL/min (ref >60)
Glucose, Bld: 100 mg/dL — ABNORMAL HIGH (ref 70–99)
Glucose, Bld: 139 mg/dL — ABNORMAL HIGH (ref 70–99)
Glucose, Bld: 154 mg/dL — ABNORMAL HIGH (ref 70–99)
Glucose, Bld: 167 mg/dL — ABNORMAL HIGH (ref 70–99)
Glucose, Bld: 191 mg/dL — ABNORMAL HIGH (ref 70–99)
Glucose, Bld: 98 mg/dL (ref 70–99)
Potassium: 3.6 mEq/L (ref 3.5–5.1)
Potassium: 4 mEq/L (ref 3.5–5.1)
Potassium: 4.2 mEq/L (ref 3.5–5.1)
Potassium: 4.2 mEq/L (ref 3.5–5.1)
Potassium: 5.3 mEq/L — ABNORMAL HIGH (ref 3.5–5.1)
Potassium: 5.4 mEq/L — ABNORMAL HIGH (ref 3.5–5.1)
Potassium: 5.5 mEq/L — ABNORMAL HIGH (ref 3.5–5.1)
Sodium: 136 mEq/L (ref 135–145)
Sodium: 138 mEq/L (ref 135–145)
Sodium: 139 mEq/L (ref 135–145)
Sodium: 140 mEq/L (ref 135–145)
Sodium: 141 mEq/L (ref 135–145)
Sodium: 141 mEq/L (ref 135–145)
Sodium: 142 mEq/L (ref 135–145)

## 2010-08-02 LAB — COMPREHENSIVE METABOLIC PANEL
ALT: 21 U/L (ref 0–53)
AST: 40 U/L — ABNORMAL HIGH (ref 0–37)
Albumin: 2.8 g/dL — ABNORMAL LOW (ref 3.5–5.2)
Alkaline Phosphatase: 45 U/L (ref 39–117)
Chloride: 108 mEq/L (ref 96–112)
GFR calc Af Amer: 59 mL/min — ABNORMAL LOW (ref >60)
Potassium: 3.9 mEq/L (ref 3.5–5.1)
Sodium: 138 mEq/L (ref 135–145)
Total Bilirubin: 1 mg/dL (ref 0.3–1.2)

## 2010-08-02 LAB — MAGNESIUM
Magnesium: 1.7 mg/dL (ref 1.5–2.5)
Magnesium: 1.7 mg/dL (ref 1.5–2.5)
Magnesium: 2.1 mg/dL (ref 1.5–2.5)
Magnesium: 2.3 mg/dL (ref 1.5–2.5)

## 2010-08-02 LAB — CULTURE, BLOOD (ROUTINE X 2): Culture: NO GROWTH

## 2010-08-02 LAB — D-DIMER, QUANTITATIVE: D-Dimer, Quant: 0.92 ug/mL-FEU — ABNORMAL HIGH (ref 0.00–0.48)

## 2010-08-02 LAB — DIFFERENTIAL
Basophils Absolute: 0.1 10*3/uL (ref 0.0–0.1)
Basophils Relative: 1 % (ref 0–1)
Eosinophils Absolute: 0.1 10*3/uL (ref 0.0–0.7)
Lymphocytes Relative: 39 % (ref 12–46)
Lymphs Abs: 2.3 10*3/uL (ref 0.7–4.0)
Lymphs Abs: 2.9 10*3/uL (ref 0.7–4.0)
Monocytes Absolute: 0.3 10*3/uL (ref 0.1–1.0)
Monocytes Relative: 5 % (ref 3–12)
Neutro Abs: 4 10*3/uL (ref 1.7–7.7)
Neutrophils Relative %: 60 % (ref 43–77)

## 2010-08-02 LAB — HEPATIC FUNCTION PANEL
ALT: 21 U/L (ref 0–53)
AST: 27 U/L (ref 0–37)
Albumin: 3.1 g/dL — ABNORMAL LOW (ref 3.5–5.2)
Bilirubin, Direct: 0.2 mg/dL (ref 0.0–0.3)
Total Protein: 7.3 g/dL (ref 6.0–8.3)

## 2010-08-02 LAB — URINE CULTURE: Colony Count: >=100000

## 2010-08-02 LAB — AMMONIA: Ammonia: 32 umol/L (ref 11–35)

## 2010-08-02 LAB — URINALYSIS, ROUTINE W REFLEX MICROSCOPIC
Glucose, UA: NEGATIVE mg/dL
Ketones, ur: NEGATIVE mg/dL
Protein, ur: 100 mg/dL — AB
Urobilinogen, UA: 0.2 mg/dL (ref 0.0–1.0)

## 2010-08-02 LAB — TYPE AND SCREEN
ABO/RH(D): O NEG
Antibody Screen: NEGATIVE

## 2010-08-02 LAB — CARDIAC PANEL(CRET KIN+CKTOT+MB+TROPI)
CK, MB: 2.2 ng/mL (ref 0.3–4.0)
CK, MB: 4.6 ng/mL — ABNORMAL HIGH (ref 0.3–4.0)
Relative Index: 1.6 (ref 0.0–2.5)
Relative Index: 1.8 (ref 0.0–2.5)
Relative Index: 1.9 (ref 0.0–2.5)
Total CK: 223 U/L (ref 7–232)
Troponin I: 0.14 ng/mL — ABNORMAL HIGH (ref 0.00–0.06)
Troponin I: 0.24 ng/mL — ABNORMAL HIGH (ref 0.00–0.06)

## 2010-08-02 LAB — CREATININE, URINE, RANDOM: Creatinine, Urine: 73.8 mg/dL

## 2010-08-02 LAB — URINE MICROSCOPIC-ADD ON

## 2010-08-02 LAB — HEMOGLOBIN A1C: Mean Plasma Glucose: 143 mg/dL — ABNORMAL HIGH (ref <117)

## 2010-08-02 LAB — CK TOTAL AND CKMB (NOT AT ARMC)
CK, MB: 2.1 ng/mL (ref 0.3–4.0)
Total CK: 29 U/L (ref 7–232)

## 2010-08-02 LAB — PHOSPHORUS: Phosphorus: 1.6 mg/dL — ABNORMAL LOW (ref 2.3–4.6)

## 2010-08-02 LAB — TROPONIN I: Troponin I: 0.01 ng/mL (ref 0.00–0.06)

## 2010-08-02 LAB — SODIUM, URINE, RANDOM: Sodium, Ur: 45 mEq/L

## 2010-08-02 LAB — MRSA PCR SCREENING: MRSA by PCR: POSITIVE — AB

## 2010-08-02 LAB — LACTIC ACID, PLASMA: Lactic Acid, Venous: 1.7 mmol/L (ref 0.5–2.2)

## 2010-08-02 LAB — BRAIN NATRIURETIC PEPTIDE: Pro B Natriuretic peptide (BNP): 356 pg/mL — ABNORMAL HIGH (ref 0.0–100.0)

## 2010-08-05 LAB — POCT CARDIAC MARKERS
CKMB, poc: <1 ng/mL — ABNORMAL LOW (ref 1.0–8.0)
Myoglobin, poc: 322 ng/mL (ref 12–200)
Troponin i, poc: <0.05 ng/mL (ref 0.00–0.09)
Troponin i, poc: <0.05 ng/mL (ref 0.00–0.09)

## 2010-08-05 LAB — URINALYSIS, ROUTINE W REFLEX MICROSCOPIC
Bilirubin Urine: NEGATIVE
Glucose, UA: 500 mg/dL — AB
Ketones, ur: 15 mg/dL — AB
Nitrite: NEGATIVE
Specific Gravity, Urine: 1.019 (ref 1.005–1.030)
pH: 6 (ref 5.0–8.0)

## 2010-08-05 LAB — BASIC METABOLIC PANEL
BUN: 32 mg/dL — ABNORMAL HIGH (ref 6–23)
CO2: 21 mEq/L (ref 19–32)
Calcium: 9.1 mg/dL (ref 8.4–10.5)
GFR calc non Af Amer: 45 mL/min — ABNORMAL LOW (ref >60)
Glucose, Bld: 275 mg/dL — ABNORMAL HIGH (ref 70–99)
Potassium: 3.9 mEq/L (ref 3.5–5.1)

## 2010-08-05 LAB — URINE MICROSCOPIC-ADD ON

## 2010-08-05 LAB — DIFFERENTIAL
Basophils Absolute: 0.1 10*3/uL (ref 0.0–0.1)
Basophils Relative: 1 % (ref 0–1)
Eosinophils Relative: 5 % (ref 0–5)
Lymphocytes Relative: 26 % (ref 12–46)
Monocytes Absolute: 0.7 10*3/uL (ref 0.1–1.0)

## 2010-08-05 LAB — URINE CULTURE

## 2010-08-05 LAB — CBC
HCT: 35.4 % — ABNORMAL LOW (ref 39.0–52.0)
MCHC: 33.5 g/dL (ref 30.0–36.0)
Platelets: 405 10*3/uL — ABNORMAL HIGH (ref 150–400)
RDW: 14.3 % (ref 11.5–15.5)

## 2010-08-07 LAB — CULTURE, BLOOD (ROUTINE X 2)
Culture: NO GROWTH
Culture: NO GROWTH

## 2010-08-07 LAB — URINALYSIS, ROUTINE W REFLEX MICROSCOPIC
Bilirubin Urine: NEGATIVE
Glucose, UA: 250 mg/dL — AB
Ketones, ur: NEGATIVE mg/dL
Nitrite: POSITIVE — AB
Protein, ur: >300 mg/dL — AB
Specific Gravity, Urine: 1.016 (ref 1.005–1.030)
Urobilinogen, UA: 0.2 mg/dL (ref 0.0–1.0)
pH: 5.5 (ref 5.0–8.0)

## 2010-08-07 LAB — COMPREHENSIVE METABOLIC PANEL
ALT: 25 U/L (ref 0–53)
AST: 23 U/L (ref 0–37)
Alkaline Phosphatase: 62 U/L (ref 39–117)
Calcium: 8.9 mg/dL (ref 8.4–10.5)
GFR calc Af Amer: 47 mL/min — ABNORMAL LOW (ref >60)
Potassium: 3.7 mEq/L (ref 3.5–5.1)
Sodium: 135 mEq/L (ref 135–145)
Total Protein: 6.4 g/dL (ref 6.0–8.3)

## 2010-08-07 LAB — COMPREHENSIVE METABOLIC PANEL WITH GFR
Albumin: 2.9 g/dL — ABNORMAL LOW (ref 3.5–5.2)
BUN: 29 mg/dL — ABNORMAL HIGH (ref 6–23)
CO2: 23 meq/L (ref 19–32)
Chloride: 105 meq/L (ref 96–112)
Creatinine, Ser: 1.86 mg/dL — ABNORMAL HIGH (ref 0.4–1.5)
GFR calc non Af Amer: 38 mL/min — ABNORMAL LOW (ref >60)
Glucose, Bld: 264 mg/dL — ABNORMAL HIGH (ref 70–99)
Total Bilirubin: 0.5 mg/dL (ref 0.3–1.2)

## 2010-08-07 LAB — CBC
HCT: 32.4 % — ABNORMAL LOW (ref 39.0–52.0)
Hemoglobin: 11 g/dL — ABNORMAL LOW (ref 13.0–17.0)
MCHC: 33.9 g/dL (ref 30.0–36.0)
MCV: 85.3 fL (ref 78.0–100.0)
Platelets: 357 K/uL (ref 150–400)
RBC: 3.8 MIL/uL — ABNORMAL LOW (ref 4.22–5.81)
RDW: 14.9 % (ref 11.5–15.5)
WBC: 7.7 K/uL (ref 4.0–10.5)

## 2010-08-07 LAB — DIFFERENTIAL
Basophils Absolute: 0 K/uL (ref 0.0–0.1)
Basophils Relative: 1 % (ref 0–1)
Eosinophils Absolute: 0.6 K/uL (ref 0.0–0.7)
Eosinophils Relative: 8 % — ABNORMAL HIGH (ref 0–5)
Lymphocytes Relative: 43 % (ref 12–46)
Lymphs Abs: 3.3 K/uL (ref 0.7–4.0)
Monocytes Absolute: 0.6 10*3/uL (ref 0.1–1.0)
Monocytes Relative: 8 % (ref 3–12)
Neutro Abs: 3.1 10*3/uL (ref 1.7–7.7)
Neutrophils Relative %: 40 % — ABNORMAL LOW (ref 43–77)

## 2010-08-07 LAB — URINE MICROSCOPIC-ADD ON

## 2010-08-07 LAB — URINE CULTURE: Colony Count: 75000

## 2010-08-08 LAB — POCT CARDIAC MARKERS: Troponin i, poc: <0.05 ng/mL (ref 0.00–0.09)

## 2010-08-08 LAB — BASIC METABOLIC PANEL
BUN: 24 mg/dL — ABNORMAL HIGH (ref 6–23)
CO2: 23 mEq/L (ref 19–32)
Chloride: 102 mEq/L (ref 96–112)
Glucose, Bld: 190 mg/dL — ABNORMAL HIGH (ref 70–99)
Potassium: 3.7 mEq/L (ref 3.5–5.1)

## 2010-08-29 LAB — BASIC METABOLIC PANEL
BUN: 17 mg/dL (ref 6–23)
BUN: 19 mg/dL (ref 6–23)
BUN: 26 mg/dL — ABNORMAL HIGH (ref 6–23)
BUN: 35 mg/dL — ABNORMAL HIGH (ref 6–23)
CO2: 21 mEq/L (ref 19–32)
CO2: 21 mEq/L (ref 19–32)
CO2: 22 mEq/L (ref 19–32)
CO2: 22 mEq/L (ref 19–32)
CO2: 23 mEq/L (ref 19–32)
CO2: 23 mEq/L (ref 19–32)
CO2: 25 mEq/L (ref 19–32)
Calcium: 8.6 mg/dL (ref 8.4–10.5)
Calcium: 8.8 mg/dL (ref 8.4–10.5)
Calcium: 8.8 mg/dL (ref 8.4–10.5)
Calcium: 9.3 mg/dL (ref 8.4–10.5)
Chloride: 106 mEq/L (ref 96–112)
Chloride: 107 mEq/L (ref 96–112)
Chloride: 108 mEq/L (ref 96–112)
Chloride: 109 mEq/L (ref 96–112)
Chloride: 109 mEq/L (ref 96–112)
Chloride: 110 mEq/L (ref 96–112)
Creatinine, Ser: 1.3 mg/dL (ref 0.4–1.5)
Creatinine, Ser: 1.38 mg/dL (ref 0.4–1.5)
Creatinine, Ser: 2.22 mg/dL — ABNORMAL HIGH (ref 0.4–1.5)
GFR calc Af Amer: >60 mL/min (ref >60)
GFR calc Af Amer: >60 mL/min (ref >60)
GFR calc Af Amer: >60 mL/min (ref >60)
GFR calc Af Amer: >60 mL/min (ref >60)
GFR calc Af Amer: >60 mL/min (ref >60)
GFR calc non Af Amer: 49 mL/min — ABNORMAL LOW (ref >60)
GFR calc non Af Amer: 57 mL/min — ABNORMAL LOW (ref >60)
GFR calc non Af Amer: 58 mL/min — ABNORMAL LOW (ref >60)
GFR calc non Af Amer: >60 mL/min (ref >60)
Glucose, Bld: 110 mg/dL — ABNORMAL HIGH (ref 70–99)
Glucose, Bld: 114 mg/dL — ABNORMAL HIGH (ref 70–99)
Glucose, Bld: 123 mg/dL — ABNORMAL HIGH (ref 70–99)
Glucose, Bld: 151 mg/dL — ABNORMAL HIGH (ref 70–99)
Glucose, Bld: 179 mg/dL — ABNORMAL HIGH (ref 70–99)
Glucose, Bld: 89 mg/dL (ref 70–99)
Potassium: 3.5 mEq/L (ref 3.5–5.1)
Potassium: 3.8 mEq/L (ref 3.5–5.1)
Potassium: 4.5 mEq/L (ref 3.5–5.1)
Potassium: 4.5 mEq/L (ref 3.5–5.1)
Potassium: 4.6 mEq/L (ref 3.5–5.1)
Sodium: 134 mEq/L — ABNORMAL LOW (ref 135–145)
Sodium: 135 mEq/L (ref 135–145)
Sodium: 136 mEq/L (ref 135–145)
Sodium: 136 mEq/L (ref 135–145)
Sodium: 136 mEq/L (ref 135–145)
Sodium: 138 mEq/L (ref 135–145)
Sodium: 141 mEq/L (ref 135–145)

## 2010-08-29 LAB — CBC
HCT: 22.5 % — ABNORMAL LOW (ref 39.0–52.0)
HCT: 23.9 % — ABNORMAL LOW (ref 39.0–52.0)
HCT: 24.3 % — ABNORMAL LOW (ref 39.0–52.0)
HCT: 24.5 % — ABNORMAL LOW (ref 39.0–52.0)
HCT: 24.7 % — ABNORMAL LOW (ref 39.0–52.0)
HCT: 26 % — ABNORMAL LOW (ref 39.0–52.0)
HCT: 26.5 % — ABNORMAL LOW (ref 39.0–52.0)
HCT: 29.8 % — ABNORMAL LOW (ref 39.0–52.0)
Hemoglobin: 10.1 g/dL — ABNORMAL LOW (ref 13.0–17.0)
Hemoglobin: 7.7 g/dL — CL (ref 13.0–17.0)
Hemoglobin: 8 g/dL — ABNORMAL LOW (ref 13.0–17.0)
Hemoglobin: 8.3 g/dL — ABNORMAL LOW (ref 13.0–17.0)
Hemoglobin: 8.4 g/dL — ABNORMAL LOW (ref 13.0–17.0)
Hemoglobin: 8.6 g/dL — ABNORMAL LOW (ref 13.0–17.0)
Hemoglobin: 8.6 g/dL — ABNORMAL LOW (ref 13.0–17.0)
Hemoglobin: 8.6 g/dL — ABNORMAL LOW (ref 13.0–17.0)
MCHC: 33.9 g/dL (ref 30.0–36.0)
MCHC: 33.9 g/dL (ref 30.0–36.0)
MCHC: 34 g/dL (ref 30.0–36.0)
MCHC: 34.1 g/dL (ref 30.0–36.0)
MCHC: 34.2 g/dL (ref 30.0–36.0)
MCHC: 34.3 g/dL (ref 30.0–36.0)
MCHC: 34.4 g/dL (ref 30.0–36.0)
MCHC: 35 g/dL (ref 30.0–36.0)
MCHC: 35.3 g/dL (ref 30.0–36.0)
MCV: 87.4 fL (ref 78.0–100.0)
MCV: 89.1 fL (ref 78.0–100.0)
MCV: 89.7 fL (ref 78.0–100.0)
MCV: 89.8 fL (ref 78.0–100.0)
MCV: 89.9 fL (ref 78.0–100.0)
MCV: 90 fL (ref 78.0–100.0)
MCV: 90.4 fL (ref 78.0–100.0)
MCV: 90.4 fL (ref 78.0–100.0)
Platelets: 243 10*3/uL (ref 150–400)
Platelets: 262 10*3/uL (ref 150–400)
Platelets: 293 10*3/uL (ref 150–400)
Platelets: 434 10*3/uL — ABNORMAL HIGH (ref 150–400)
Platelets: 472 10*3/uL — ABNORMAL HIGH (ref 150–400)
RBC: 2.61 MIL/uL — ABNORMAL LOW (ref 4.22–5.81)
RBC: 2.71 MIL/uL — ABNORMAL LOW (ref 4.22–5.81)
RBC: 2.74 MIL/uL — ABNORMAL LOW (ref 4.22–5.81)
RBC: 2.75 MIL/uL — ABNORMAL LOW (ref 4.22–5.81)
RBC: 2.89 MIL/uL — ABNORMAL LOW (ref 4.22–5.81)
RBC: 2.93 MIL/uL — ABNORMAL LOW (ref 4.22–5.81)
RBC: 2.95 MIL/uL — ABNORMAL LOW (ref 4.22–5.81)
RBC: 3.23 MIL/uL — ABNORMAL LOW (ref 4.22–5.81)
RBC: 3.3 MIL/uL — ABNORMAL LOW (ref 4.22–5.81)
RDW: 15 % (ref 11.5–15.5)
RDW: 15.1 % (ref 11.5–15.5)
RDW: 15.2 % (ref 11.5–15.5)
RDW: 15.3 % (ref 11.5–15.5)
RDW: 15.3 % (ref 11.5–15.5)
RDW: 15.4 % (ref 11.5–15.5)
RDW: 15.4 % (ref 11.5–15.5)
RDW: 15.5 % (ref 11.5–15.5)
RDW: 16.1 % — ABNORMAL HIGH (ref 11.5–15.5)
WBC: 10.6 10*3/uL — ABNORMAL HIGH (ref 4.0–10.5)
WBC: 11.2 10*3/uL — ABNORMAL HIGH (ref 4.0–10.5)
WBC: 12.6 10*3/uL — ABNORMAL HIGH (ref 4.0–10.5)
WBC: 13.4 10*3/uL — ABNORMAL HIGH (ref 4.0–10.5)
WBC: 8.9 10*3/uL (ref 4.0–10.5)
WBC: 9.2 10*3/uL (ref 4.0–10.5)

## 2010-08-29 LAB — HEMOGLOBIN AND HEMATOCRIT, BLOOD
HCT: 21.6 % — ABNORMAL LOW (ref 39.0–52.0)
HCT: 22.5 % — ABNORMAL LOW (ref 39.0–52.0)
HCT: 23 % — ABNORMAL LOW (ref 39.0–52.0)
HCT: 25.3 % — ABNORMAL LOW (ref 39.0–52.0)
HCT: 25.4 % — ABNORMAL LOW (ref 39.0–52.0)
HCT: 26.9 % — ABNORMAL LOW (ref 39.0–52.0)
HCT: 29.1 % — ABNORMAL LOW (ref 39.0–52.0)
Hemoglobin: 10.1 g/dL — ABNORMAL LOW (ref 13.0–17.0)
Hemoglobin: 7.8 g/dL — CL (ref 13.0–17.0)
Hemoglobin: 8 g/dL — ABNORMAL LOW (ref 13.0–17.0)
Hemoglobin: 8.5 g/dL — ABNORMAL LOW (ref 13.0–17.0)
Hemoglobin: 8.6 g/dL — ABNORMAL LOW (ref 13.0–17.0)
Hemoglobin: 9.1 g/dL — ABNORMAL LOW (ref 13.0–17.0)
Hemoglobin: 9.7 g/dL — ABNORMAL LOW (ref 13.0–17.0)

## 2010-08-29 LAB — COMPREHENSIVE METABOLIC PANEL
ALT: 10 U/L (ref 0–53)
ALT: <8 U/L (ref 0–53)
AST: 10 U/L (ref 0–37)
AST: 13 U/L (ref 0–37)
Albumin: 2.1 g/dL — ABNORMAL LOW (ref 3.5–5.2)
Alkaline Phosphatase: 61 U/L (ref 39–117)
Alkaline Phosphatase: 83 U/L (ref 39–117)
BUN: 16 mg/dL (ref 6–23)
BUN: 34 mg/dL — ABNORMAL HIGH (ref 6–23)
CO2: 22 mEq/L (ref 19–32)
CO2: 22 mEq/L (ref 19–32)
Calcium: 8.7 mg/dL (ref 8.4–10.5)
Calcium: 9.1 mg/dL (ref 8.4–10.5)
Chloride: 107 mEq/L (ref 96–112)
Chloride: 109 mEq/L (ref 96–112)
Creatinine, Ser: 1.63 mg/dL — ABNORMAL HIGH (ref 0.4–1.5)
GFR calc Af Amer: 54 mL/min — ABNORMAL LOW (ref >60)
GFR calc Af Amer: >60 mL/min (ref >60)
GFR calc non Af Amer: 45 mL/min — ABNORMAL LOW (ref >60)
GFR calc non Af Amer: 56 mL/min — ABNORMAL LOW (ref >60)
Glucose, Bld: 162 mg/dL — ABNORMAL HIGH (ref 70–99)
Potassium: 4 mEq/L (ref 3.5–5.1)
Potassium: 5 mEq/L (ref 3.5–5.1)
Sodium: 137 mEq/L (ref 135–145)
Sodium: 139 mEq/L (ref 135–145)
Total Bilirubin: 0.5 mg/dL (ref 0.3–1.2)
Total Bilirubin: 0.7 mg/dL (ref 0.3–1.2)
Total Protein: 5.5 g/dL — ABNORMAL LOW (ref 6.0–8.3)

## 2010-08-29 LAB — GLUCOSE, CAPILLARY
Glucose-Capillary: 100 mg/dL — ABNORMAL HIGH (ref 70–99)
Glucose-Capillary: 108 mg/dL — ABNORMAL HIGH (ref 70–99)
Glucose-Capillary: 113 mg/dL — ABNORMAL HIGH (ref 70–99)
Glucose-Capillary: 117 mg/dL — ABNORMAL HIGH (ref 70–99)
Glucose-Capillary: 118 mg/dL — ABNORMAL HIGH (ref 70–99)
Glucose-Capillary: 122 mg/dL — ABNORMAL HIGH (ref 70–99)
Glucose-Capillary: 124 mg/dL — ABNORMAL HIGH (ref 70–99)
Glucose-Capillary: 127 mg/dL — ABNORMAL HIGH (ref 70–99)
Glucose-Capillary: 128 mg/dL — ABNORMAL HIGH (ref 70–99)
Glucose-Capillary: 136 mg/dL — ABNORMAL HIGH (ref 70–99)
Glucose-Capillary: 140 mg/dL — ABNORMAL HIGH (ref 70–99)
Glucose-Capillary: 140 mg/dL — ABNORMAL HIGH (ref 70–99)
Glucose-Capillary: 149 mg/dL — ABNORMAL HIGH (ref 70–99)
Glucose-Capillary: 155 mg/dL — ABNORMAL HIGH (ref 70–99)
Glucose-Capillary: 158 mg/dL — ABNORMAL HIGH (ref 70–99)
Glucose-Capillary: 158 mg/dL — ABNORMAL HIGH (ref 70–99)
Glucose-Capillary: 160 mg/dL — ABNORMAL HIGH (ref 70–99)
Glucose-Capillary: 160 mg/dL — ABNORMAL HIGH (ref 70–99)
Glucose-Capillary: 169 mg/dL — ABNORMAL HIGH (ref 70–99)
Glucose-Capillary: 183 mg/dL — ABNORMAL HIGH (ref 70–99)
Glucose-Capillary: 209 mg/dL — ABNORMAL HIGH (ref 70–99)
Glucose-Capillary: 243 mg/dL — ABNORMAL HIGH (ref 70–99)
Glucose-Capillary: 280 mg/dL — ABNORMAL HIGH (ref 70–99)
Glucose-Capillary: 86 mg/dL (ref 70–99)

## 2010-08-29 LAB — RETICULOCYTES
RBC.: 2.63 MIL/uL — ABNORMAL LOW (ref 4.22–5.81)
Retic Count, Absolute: 44.7 10*3/uL (ref 19.0–186.0)

## 2010-08-29 LAB — CROSSMATCH

## 2010-08-29 LAB — PREPARE RBC (CROSSMATCH)

## 2010-08-29 LAB — RENAL FUNCTION PANEL
Albumin: 2.3 g/dL — ABNORMAL LOW (ref 3.5–5.2)
Chloride: 102 mEq/L (ref 96–112)
Creatinine, Ser: 3.66 mg/dL — ABNORMAL HIGH (ref 0.4–1.5)
GFR calc Af Amer: 21 mL/min — ABNORMAL LOW (ref >60)
GFR calc non Af Amer: 18 mL/min — ABNORMAL LOW (ref >60)
Potassium: 3.3 mEq/L — ABNORMAL LOW (ref 3.5–5.1)
Sodium: 143 mEq/L (ref 135–145)

## 2010-08-29 LAB — MAGNESIUM
Magnesium: 1.6 mg/dL (ref 1.5–2.5)
Magnesium: 1.7 mg/dL (ref 1.5–2.5)

## 2010-08-29 LAB — IRON AND TIBC: TIBC: 132 ug/dL — ABNORMAL LOW (ref 215–435)

## 2010-08-29 LAB — FOLATE: Folate: 10 ng/mL

## 2010-08-29 LAB — FERRITIN: Ferritin: 632 ng/mL — ABNORMAL HIGH (ref 22–322)

## 2010-08-30 LAB — POCT I-STAT EG7
Acid-base deficit: 9 mmol/L — ABNORMAL HIGH (ref 0.0–2.0)
Bicarbonate: 17.7 mEq/L — ABNORMAL LOW (ref 20.0–24.0)
Bicarbonate: 17.9 mEq/L — ABNORMAL LOW (ref 20.0–24.0)
Calcium, Ion: 1.21 mmol/L (ref 1.12–1.32)
HCT: 30 % — ABNORMAL LOW (ref 39.0–52.0)
Hemoglobin: 10.2 g/dL — ABNORMAL LOW (ref 13.0–17.0)
O2 Saturation: 41 %
Potassium: 5.1 mEq/L (ref 3.5–5.1)
Potassium: 5.4 mEq/L — ABNORMAL HIGH (ref 3.5–5.1)
Sodium: 136 mEq/L (ref 135–145)
TCO2: 19 mmol/L (ref 0–100)
pCO2, Ven: 38.1 mmHg — ABNORMAL LOW (ref 45.0–50.0)
pH, Ven: 7.26 (ref 7.250–7.300)

## 2010-08-30 LAB — GLUCOSE, CAPILLARY
Glucose-Capillary: 102 mg/dL — ABNORMAL HIGH (ref 70–99)
Glucose-Capillary: 109 mg/dL — ABNORMAL HIGH (ref 70–99)
Glucose-Capillary: 113 mg/dL — ABNORMAL HIGH (ref 70–99)
Glucose-Capillary: 114 mg/dL — ABNORMAL HIGH (ref 70–99)
Glucose-Capillary: 116 mg/dL — ABNORMAL HIGH (ref 70–99)
Glucose-Capillary: 119 mg/dL — ABNORMAL HIGH (ref 70–99)
Glucose-Capillary: 122 mg/dL — ABNORMAL HIGH (ref 70–99)
Glucose-Capillary: 127 mg/dL — ABNORMAL HIGH (ref 70–99)
Glucose-Capillary: 130 mg/dL — ABNORMAL HIGH (ref 70–99)
Glucose-Capillary: 135 mg/dL — ABNORMAL HIGH (ref 70–99)
Glucose-Capillary: 137 mg/dL — ABNORMAL HIGH (ref 70–99)
Glucose-Capillary: 138 mg/dL — ABNORMAL HIGH (ref 70–99)
Glucose-Capillary: 141 mg/dL — ABNORMAL HIGH (ref 70–99)
Glucose-Capillary: 157 mg/dL — ABNORMAL HIGH (ref 70–99)
Glucose-Capillary: 158 mg/dL — ABNORMAL HIGH (ref 70–99)
Glucose-Capillary: 173 mg/dL — ABNORMAL HIGH (ref 70–99)
Glucose-Capillary: 174 mg/dL — ABNORMAL HIGH (ref 70–99)
Glucose-Capillary: 214 mg/dL — ABNORMAL HIGH (ref 70–99)
Glucose-Capillary: 215 mg/dL — ABNORMAL HIGH (ref 70–99)
Glucose-Capillary: 220 mg/dL — ABNORMAL HIGH (ref 70–99)
Glucose-Capillary: 236 mg/dL — ABNORMAL HIGH (ref 70–99)
Glucose-Capillary: 259 mg/dL — ABNORMAL HIGH (ref 70–99)
Glucose-Capillary: 309 mg/dL — ABNORMAL HIGH (ref 70–99)
Glucose-Capillary: 314 mg/dL — ABNORMAL HIGH (ref 70–99)
Glucose-Capillary: 81 mg/dL (ref 70–99)
Glucose-Capillary: 86 mg/dL (ref 70–99)
Glucose-Capillary: 95 mg/dL (ref 70–99)

## 2010-08-30 LAB — CROSSMATCH
ABO/RH(D): O NEG
ABO/RH(D): O NEG

## 2010-08-30 LAB — CBC
HCT: 21.9 % — ABNORMAL LOW (ref 39.0–52.0)
HCT: 25.6 % — ABNORMAL LOW (ref 39.0–52.0)
HCT: 27.1 % — ABNORMAL LOW (ref 39.0–52.0)
HCT: 28.7 % — ABNORMAL LOW (ref 39.0–52.0)
HCT: 30.5 % — ABNORMAL LOW (ref 39.0–52.0)
HCT: 30.7 % — ABNORMAL LOW (ref 39.0–52.0)
HCT: 31.5 % — ABNORMAL LOW (ref 39.0–52.0)
HCT: 33.7 % — ABNORMAL LOW (ref 39.0–52.0)
HCT: 34 % — ABNORMAL LOW (ref 39.0–52.0)
Hemoglobin: 10 g/dL — ABNORMAL LOW (ref 13.0–17.0)
Hemoglobin: 10.1 g/dL — ABNORMAL LOW (ref 13.0–17.0)
Hemoglobin: 10.6 g/dL — ABNORMAL LOW (ref 13.0–17.0)
Hemoglobin: 10.8 g/dL — ABNORMAL LOW (ref 13.0–17.0)
Hemoglobin: 11.6 g/dL — ABNORMAL LOW (ref 13.0–17.0)
Hemoglobin: 6.8 g/dL — CL (ref 13.0–17.0)
Hemoglobin: 7.8 g/dL — CL (ref 13.0–17.0)
Hemoglobin: 8.2 g/dL — ABNORMAL LOW (ref 13.0–17.0)
MCHC: 33.8 g/dL (ref 30.0–36.0)
MCHC: 34 g/dL (ref 30.0–36.0)
MCHC: 34.6 g/dL (ref 30.0–36.0)
MCHC: 35 g/dL (ref 30.0–36.0)
MCHC: 35.1 g/dL (ref 30.0–36.0)
MCHC: 35.4 g/dL (ref 30.0–36.0)
MCHC: 35.6 g/dL (ref 30.0–36.0)
MCHC: 35.6 g/dL (ref 30.0–36.0)
MCV: 84.2 fL (ref 78.0–100.0)
MCV: 84.2 fL (ref 78.0–100.0)
MCV: 84.4 fL (ref 78.0–100.0)
MCV: 84.6 fL (ref 78.0–100.0)
MCV: 87.2 fL (ref 78.0–100.0)
MCV: 89.6 fL (ref 78.0–100.0)
MCV: 90 fL (ref 78.0–100.0)
MCV: 90 fL (ref 78.0–100.0)
MCV: 90.7 fL (ref 78.0–100.0)
Platelets: 192 10*3/uL (ref 150–400)
Platelets: 204 10*3/uL (ref 150–400)
Platelets: 208 10*3/uL (ref 150–400)
Platelets: 208 10*3/uL (ref 150–400)
Platelets: 220 10*3/uL (ref 150–400)
Platelets: 250 10*3/uL (ref 150–400)
Platelets: 284 10*3/uL (ref 150–400)
Platelets: 293 10*3/uL (ref 150–400)
Platelets: 301 10*3/uL (ref 150–400)
Platelets: 321 10*3/uL (ref 150–400)
Platelets: 341 10*3/uL (ref 150–400)
RBC: 2.45 MIL/uL — ABNORMAL LOW (ref 4.22–5.81)
RBC: 2.58 MIL/uL — ABNORMAL LOW (ref 4.22–5.81)
RBC: 2.85 MIL/uL — ABNORMAL LOW (ref 4.22–5.81)
RBC: 3.01 MIL/uL — ABNORMAL LOW (ref 4.22–5.81)
RBC: 3.04 MIL/uL — ABNORMAL LOW (ref 4.22–5.81)
RBC: 3.47 MIL/uL — ABNORMAL LOW (ref 4.22–5.81)
RBC: 3.52 MIL/uL — ABNORMAL LOW (ref 4.22–5.81)
RBC: 3.99 MIL/uL — ABNORMAL LOW (ref 4.22–5.81)
RDW: 13 % (ref 11.5–15.5)
RDW: 13.5 % (ref 11.5–15.5)
RDW: 14.6 % (ref 11.5–15.5)
RDW: 14.8 % (ref 11.5–15.5)
RDW: 15.1 % (ref 11.5–15.5)
RDW: 15.3 % (ref 11.5–15.5)
RDW: 17.4 % — ABNORMAL HIGH (ref 11.5–15.5)
WBC: 10.1 10*3/uL (ref 4.0–10.5)
WBC: 11.1 10*3/uL — ABNORMAL HIGH (ref 4.0–10.5)
WBC: 12.1 10*3/uL — ABNORMAL HIGH (ref 4.0–10.5)
WBC: 14.4 10*3/uL — ABNORMAL HIGH (ref 4.0–10.5)
WBC: 17.1 10*3/uL — ABNORMAL HIGH (ref 4.0–10.5)
WBC: 20.5 10*3/uL — ABNORMAL HIGH (ref 4.0–10.5)
WBC: 25.4 10*3/uL — ABNORMAL HIGH (ref 4.0–10.5)
WBC: 6.5 10*3/uL (ref 4.0–10.5)
WBC: 7.5 10*3/uL (ref 4.0–10.5)
WBC: 9.4 10*3/uL (ref 4.0–10.5)
WBC: 9.7 10*3/uL (ref 4.0–10.5)

## 2010-08-30 LAB — BLOOD GAS, VENOUS
Acid-base deficit: 5.9 mmol/L — ABNORMAL HIGH (ref 0.0–2.0)
Bicarbonate: 17.3 mEq/L — ABNORMAL LOW (ref 20.0–24.0)
MECHVT: 560 mL
O2 Saturation: 83.8 %
PEEP: 5 cmH2O
Patient temperature: 98.6
RATE: 20 resp/min
TCO2: 18.1 mmol/L (ref 0–100)
pCO2, Ven: 28.3 mmHg — ABNORMAL LOW (ref 45.0–50.0)
pH, Ven: 7.423 — ABNORMAL HIGH (ref 7.250–7.300)
pO2, Ven: 46.9 mmHg — ABNORMAL HIGH (ref 30.0–45.0)

## 2010-08-30 LAB — IRON AND TIBC
Iron: 90 ug/dL (ref 42–135)
Saturation Ratios: 50 % (ref 20–55)
TIBC: 180 ug/dL — ABNORMAL LOW (ref 215–435)

## 2010-08-30 LAB — BASIC METABOLIC PANEL
BUN: 29 mg/dL — ABNORMAL HIGH (ref 6–23)
BUN: 29 mg/dL — ABNORMAL HIGH (ref 6–23)
BUN: 31 mg/dL — ABNORMAL HIGH (ref 6–23)
BUN: 37 mg/dL — ABNORMAL HIGH (ref 6–23)
BUN: 38 mg/dL — ABNORMAL HIGH (ref 6–23)
BUN: 40 mg/dL — ABNORMAL HIGH (ref 6–23)
BUN: 45 mg/dL — ABNORMAL HIGH (ref 6–23)
BUN: 63 mg/dL — ABNORMAL HIGH (ref 6–23)
BUN: 64 mg/dL — ABNORMAL HIGH (ref 6–23)
CO2: 18 mEq/L — ABNORMAL LOW (ref 19–32)
CO2: 18 mEq/L — ABNORMAL LOW (ref 19–32)
CO2: 18 mEq/L — ABNORMAL LOW (ref 19–32)
CO2: 19 mEq/L (ref 19–32)
CO2: 20 mEq/L (ref 19–32)
CO2: 20 mEq/L (ref 19–32)
Calcium: 7.8 mg/dL — ABNORMAL LOW (ref 8.4–10.5)
Calcium: 7.9 mg/dL — ABNORMAL LOW (ref 8.4–10.5)
Calcium: 8.6 mg/dL (ref 8.4–10.5)
Calcium: 8.8 mg/dL (ref 8.4–10.5)
Chloride: 102 mEq/L (ref 96–112)
Chloride: 102 mEq/L (ref 96–112)
Chloride: 107 mEq/L (ref 96–112)
Chloride: 108 mEq/L (ref 96–112)
Chloride: 110 mEq/L (ref 96–112)
Chloride: 111 mEq/L (ref 96–112)
Chloride: 111 mEq/L (ref 96–112)
Chloride: 113 mEq/L — ABNORMAL HIGH (ref 96–112)
Chloride: 113 mEq/L — ABNORMAL HIGH (ref 96–112)
Creatinine, Ser: 1.33 mg/dL (ref 0.4–1.5)
Creatinine, Ser: 1.49 mg/dL (ref 0.4–1.5)
Creatinine, Ser: 1.52 mg/dL — ABNORMAL HIGH (ref 0.4–1.5)
Creatinine, Ser: 1.55 mg/dL — ABNORMAL HIGH (ref 0.4–1.5)
Creatinine, Ser: 1.63 mg/dL — ABNORMAL HIGH (ref 0.4–1.5)
Creatinine, Ser: 6.34 mg/dL — ABNORMAL HIGH (ref 0.4–1.5)
GFR calc Af Amer: 23 mL/min — ABNORMAL LOW (ref >60)
GFR calc Af Amer: 50 mL/min — ABNORMAL LOW (ref >60)
GFR calc Af Amer: 51 mL/min — ABNORMAL LOW (ref >60)
GFR calc Af Amer: 54 mL/min — ABNORMAL LOW (ref >60)
GFR calc Af Amer: 59 mL/min — ABNORMAL LOW (ref >60)
GFR calc Af Amer: >60 mL/min (ref >60)
GFR calc Af Amer: >60 mL/min (ref >60)
GFR calc non Af Amer: 12 mL/min — ABNORMAL LOW (ref >60)
GFR calc non Af Amer: 42 mL/min — ABNORMAL LOW (ref >60)
GFR calc non Af Amer: 45 mL/min — ABNORMAL LOW (ref >60)
GFR calc non Af Amer: 47 mL/min — ABNORMAL LOW (ref >60)
GFR calc non Af Amer: 49 mL/min — ABNORMAL LOW (ref >60)
GFR calc non Af Amer: 50 mL/min — ABNORMAL LOW (ref >60)
GFR calc non Af Amer: 51 mL/min — ABNORMAL LOW (ref >60)
GFR calc non Af Amer: 9 mL/min — ABNORMAL LOW (ref >60)
Glucose, Bld: 106 mg/dL — ABNORMAL HIGH (ref 70–99)
Glucose, Bld: 116 mg/dL — ABNORMAL HIGH (ref 70–99)
Glucose, Bld: 122 mg/dL — ABNORMAL HIGH (ref 70–99)
Glucose, Bld: 124 mg/dL — ABNORMAL HIGH (ref 70–99)
Glucose, Bld: 181 mg/dL — ABNORMAL HIGH (ref 70–99)
Glucose, Bld: 202 mg/dL — ABNORMAL HIGH (ref 70–99)
Glucose, Bld: 88 mg/dL (ref 70–99)
Potassium: 4.4 mEq/L (ref 3.5–5.1)
Potassium: 4.6 mEq/L (ref 3.5–5.1)
Potassium: 4.6 mEq/L (ref 3.5–5.1)
Potassium: 4.9 mEq/L (ref 3.5–5.1)
Potassium: 5 mEq/L (ref 3.5–5.1)
Potassium: 5.1 mEq/L (ref 3.5–5.1)
Potassium: 5.2 mEq/L — ABNORMAL HIGH (ref 3.5–5.1)
Potassium: 5.8 mEq/L — ABNORMAL HIGH (ref 3.5–5.1)
Potassium: 5.9 mEq/L — ABNORMAL HIGH (ref 3.5–5.1)
Sodium: 129 mEq/L — ABNORMAL LOW (ref 135–145)
Sodium: 132 mEq/L — ABNORMAL LOW (ref 135–145)
Sodium: 134 mEq/L — ABNORMAL LOW (ref 135–145)
Sodium: 134 mEq/L — ABNORMAL LOW (ref 135–145)

## 2010-08-30 LAB — CULTURE, BLOOD (ROUTINE X 2)
Culture: NO GROWTH
Culture: NO GROWTH

## 2010-08-30 LAB — HEMOGLOBIN AND HEMATOCRIT, BLOOD
HCT: 20.1 % — ABNORMAL LOW (ref 39.0–52.0)
HCT: 22.2 % — ABNORMAL LOW (ref 39.0–52.0)
HCT: 22.4 % — ABNORMAL LOW (ref 39.0–52.0)
HCT: 22.6 % — ABNORMAL LOW (ref 39.0–52.0)
HCT: 23.8 % — ABNORMAL LOW (ref 39.0–52.0)
HCT: 23.9 % — ABNORMAL LOW (ref 39.0–52.0)
HCT: 24.6 % — ABNORMAL LOW (ref 39.0–52.0)
HCT: 26.8 % — ABNORMAL LOW (ref 39.0–52.0)
HCT: 28.3 % — ABNORMAL LOW (ref 39.0–52.0)
HCT: 29 % — ABNORMAL LOW (ref 39.0–52.0)
HCT: 29.5 % — ABNORMAL LOW (ref 39.0–52.0)
HCT: 31.4 % — ABNORMAL LOW (ref 39.0–52.0)
HCT: 33.2 % — ABNORMAL LOW (ref 39.0–52.0)
Hemoglobin: 10.2 g/dL — ABNORMAL LOW (ref 13.0–17.0)
Hemoglobin: 11.3 g/dL — ABNORMAL LOW (ref 13.0–17.0)
Hemoglobin: 7.1 g/dL — CL (ref 13.0–17.0)
Hemoglobin: 8 g/dL — ABNORMAL LOW (ref 13.0–17.0)
Hemoglobin: 8.3 g/dL — ABNORMAL LOW (ref 13.0–17.0)
Hemoglobin: 8.5 g/dL — ABNORMAL LOW (ref 13.0–17.0)
Hemoglobin: 9.2 g/dL — ABNORMAL LOW (ref 13.0–17.0)
Hemoglobin: 9.3 g/dL — ABNORMAL LOW (ref 13.0–17.0)
Hemoglobin: 9.4 g/dL — ABNORMAL LOW (ref 13.0–17.0)
Hemoglobin: 9.7 g/dL — ABNORMAL LOW (ref 13.0–17.0)
Hemoglobin: 9.9 g/dL — ABNORMAL LOW (ref 13.0–17.0)

## 2010-08-30 LAB — CARDIAC PANEL(CRET KIN+CKTOT+MB+TROPI)
CK, MB: 2.3 ng/mL (ref 0.3–4.0)
Relative Index: INVALID (ref 0.0–2.5)
Troponin I: 0.02 ng/mL (ref 0.00–0.06)
Troponin I: 0.3 ng/mL — ABNORMAL HIGH (ref 0.00–0.06)

## 2010-08-30 LAB — PREPARE FRESH FROZEN PLASMA

## 2010-08-30 LAB — PREPARE RBC (CROSSMATCH)

## 2010-08-30 LAB — COMPREHENSIVE METABOLIC PANEL
ALT: <8 U/L (ref 0–53)
ALT: <8 U/L (ref 0–53)
ALT: <8 U/L (ref 0–53)
ALT: <8 U/L (ref 0–53)
AST: 10 U/L (ref 0–37)
AST: 15 U/L (ref 0–37)
Albumin: 2.2 g/dL — ABNORMAL LOW (ref 3.5–5.2)
Albumin: 2.3 g/dL — ABNORMAL LOW (ref 3.5–5.2)
Albumin: 2.4 g/dL — ABNORMAL LOW (ref 3.5–5.2)
Alkaline Phosphatase: 57 U/L (ref 39–117)
BUN: 64 mg/dL — ABNORMAL HIGH (ref 6–23)
CO2: 14 mEq/L — ABNORMAL LOW (ref 19–32)
Calcium: 7.6 mg/dL — ABNORMAL LOW (ref 8.4–10.5)
Calcium: 8.4 mg/dL (ref 8.4–10.5)
Chloride: 103 mEq/L (ref 96–112)
Creatinine, Ser: 6.45 mg/dL — ABNORMAL HIGH (ref 0.4–1.5)
GFR calc Af Amer: 13 mL/min — ABNORMAL LOW (ref >60)
GFR calc non Af Amer: 11 mL/min — ABNORMAL LOW (ref >60)
GFR calc non Af Amer: 9 mL/min — ABNORMAL LOW (ref >60)
Glucose, Bld: 91 mg/dL (ref 70–99)
Potassium: 3.4 mEq/L — ABNORMAL LOW (ref 3.5–5.1)
Potassium: 4.1 mEq/L (ref 3.5–5.1)
Sodium: 133 mEq/L — ABNORMAL LOW (ref 135–145)
Sodium: 138 mEq/L (ref 135–145)
Sodium: 139 mEq/L (ref 135–145)
Total Bilirubin: 1.2 mg/dL (ref 0.3–1.2)
Total Bilirubin: 2 mg/dL — ABNORMAL HIGH (ref 0.3–1.2)
Total Protein: 4.9 g/dL — ABNORMAL LOW (ref 6.0–8.3)
Total Protein: 5.1 g/dL — ABNORMAL LOW (ref 6.0–8.3)

## 2010-08-30 LAB — WOUND CULTURE: Culture: NO GROWTH

## 2010-08-30 LAB — URINE CULTURE

## 2010-08-30 LAB — BLOOD GAS, ARTERIAL
Acid-base deficit: 11.5 mmol/L — ABNORMAL HIGH (ref 0.0–2.0)
Acid-base deficit: 8.1 mmol/L — ABNORMAL HIGH (ref 0.0–2.0)
Bicarbonate: 14.7 mEq/L — ABNORMAL LOW (ref 20.0–24.0)
Bicarbonate: 16.7 mEq/L — ABNORMAL LOW (ref 20.0–24.0)
FIO2: 0.5 %
O2 Saturation: 99.5 %
PEEP: 5 cmH2O
PEEP: 5 cmH2O
RATE: 20 resp/min
TCO2: 18.4 mmol/L (ref 0–100)
pCO2 arterial: 21.6 mmHg — ABNORMAL LOW (ref 35.0–45.0)
pCO2 arterial: 33.8 mmHg — ABNORMAL LOW (ref 35.0–45.0)
pCO2 arterial: 57.7 mmHg (ref 35.0–45.0)
pO2, Arterial: 104 mmHg — ABNORMAL HIGH (ref 80.0–100.0)
pO2, Arterial: 187 mmHg — ABNORMAL HIGH (ref 80.0–100.0)
pO2, Arterial: 49.4 mmHg — ABNORMAL LOW (ref 80.0–100.0)

## 2010-08-30 LAB — POCT CARDIAC MARKERS
CKMB, poc: 2.5 ng/mL (ref 1.0–8.0)
Myoglobin, poc: 118 ng/mL (ref 12–200)
Troponin i, poc: 0.21 ng/mL — ABNORMAL HIGH (ref 0.00–0.09)
Troponin i, poc: <0.05 ng/mL (ref 0.00–0.09)

## 2010-08-30 LAB — TROPONIN I
Troponin I: 0.38 ng/mL — ABNORMAL HIGH (ref 0.00–0.06)
Troponin I: 0.64 ng/mL (ref 0.00–0.06)

## 2010-08-30 LAB — PHOSPHORUS: Phosphorus: 7.7 mg/dL — ABNORMAL HIGH (ref 2.3–4.6)

## 2010-08-30 LAB — RETICULOCYTES: Retic Count, Absolute: 58 10*3/uL (ref 19.0–186.0)

## 2010-08-30 LAB — LACTIC ACID, PLASMA: Lactic Acid, Venous: 1 mmol/L (ref 0.5–2.2)

## 2010-08-30 LAB — HEPATIC FUNCTION PANEL
Albumin: 2.5 g/dL — ABNORMAL LOW (ref 3.5–5.2)
Alkaline Phosphatase: 53 U/L (ref 39–117)
Total Bilirubin: 0.3 mg/dL (ref 0.3–1.2)
Total Protein: 5.1 g/dL — ABNORMAL LOW (ref 6.0–8.3)

## 2010-08-30 LAB — URINE MICROSCOPIC-ADD ON

## 2010-08-30 LAB — PROTIME-INR
INR: 1 (ref 0.00–1.49)
INR: 1.3 (ref 0.00–1.49)
INR: 2 — ABNORMAL HIGH (ref 0.00–1.49)
INR: 2.2 — ABNORMAL HIGH (ref 0.00–1.49)
Prothrombin Time: 13.9 seconds (ref 11.6–15.2)
Prothrombin Time: 16.2 seconds — ABNORMAL HIGH (ref 11.6–15.2)
Prothrombin Time: 17.9 seconds — ABNORMAL HIGH (ref 11.6–15.2)
Prothrombin Time: 24.2 seconds — ABNORMAL HIGH (ref 11.6–15.2)
Prothrombin Time: 26.2 seconds — ABNORMAL HIGH (ref 11.6–15.2)
Prothrombin Time: 30.3 seconds — ABNORMAL HIGH (ref 11.6–15.2)

## 2010-08-30 LAB — MAGNESIUM: Magnesium: 2.4 mg/dL (ref 1.5–2.5)

## 2010-08-30 LAB — DIFFERENTIAL
Eosinophils Relative: 4 % (ref 0–5)
Lymphocytes Relative: 29 % (ref 12–46)
Lymphs Abs: 2.7 10*3/uL (ref 0.7–4.0)
Monocytes Absolute: 0.7 10*3/uL (ref 0.1–1.0)

## 2010-08-30 LAB — DIC (DISSEMINATED INTRAVASCULAR COAGULATION)PANEL
D-Dimer, Quant: 1.39 ug/mL-FEU — ABNORMAL HIGH (ref 0.00–0.48)
INR: 2.3 — ABNORMAL HIGH (ref 0.00–1.49)
Prothrombin Time: 26.5 seconds — ABNORMAL HIGH (ref 11.6–15.2)

## 2010-08-30 LAB — URINALYSIS, ROUTINE W REFLEX MICROSCOPIC
Bilirubin Urine: NEGATIVE
Specific Gravity, Urine: 1.022 (ref 1.005–1.030)
pH: 5.5 (ref 5.0–8.0)

## 2010-08-30 LAB — APTT: aPTT: 51 seconds — ABNORMAL HIGH (ref 24–37)

## 2010-08-30 LAB — CULTURE, BAL-QUANTITATIVE W GRAM STAIN

## 2010-08-30 LAB — FOLATE: Folate: >20 ng/mL

## 2010-08-30 LAB — HEPARIN LEVEL (UNFRACTIONATED): Heparin Unfractionated: <0.1 IU/mL — ABNORMAL LOW (ref 0.30–0.70)

## 2010-08-30 LAB — TSH: TSH: 2.051 u[IU]/mL (ref 0.350–4.500)

## 2010-08-30 LAB — CK TOTAL AND CKMB (NOT AT ARMC)
CK, MB: 4.6 ng/mL — ABNORMAL HIGH (ref 0.3–4.0)
Total CK: 40 U/L (ref 7–232)

## 2010-08-30 LAB — HEPATITIS B SURFACE ANTIBODY,QUALITATIVE: Hep B S Ab: POSITIVE — AB

## 2010-08-30 LAB — CREATININE, URINE, RANDOM: Creatinine, Urine: 38.8 mg/dL
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
## 2010-10-02 NOTE — Assessment & Plan Note (Signed)
OFFICE VISIT   [name], [name]  DOB:  13-Mar-1956                                       09/27/2008  ZOXWR#:[id number]   I saw the patient for followup after he had undergone exploration of his  left groin with evacuation of hematoma and repair of the left common  femoral artery on 08/11/2008.  This was for a significant hematoma that  he had developed after cardiac catheterization.  Overall he has been  doing well and has had no problems with the incision in the left groin.  He has had some scrotal swelling which is gradually improving.   On examination the incision in the left groin is completely healed.  There is no erythema or drainage.  Overall I am pleased with his  progress and I will see him back p.r.n.   Di Kindle. Edilia Bo, M.D.  Electronically Signed   CSD/MEDQ  D:  09/27/2008  T:  09/28/2008  Job:  [id number]
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
## 2010-10-02 NOTE — Op Note (Signed)
NAMEELLARD, NAN               ACCOUNT NO.:  [id number]   MEDICAL RECORD NO.:  [id number]          PATIENT TYPE:  INP   LOCATION:  2927                         FACILITY:  [facility]   PHYSICIAN:  Di Kindle. Edilia Bo, M.D.DATE OF BIRTH:  May 11, 1956   DATE OF PROCEDURE:  08/11/2008  DATE OF DISCHARGE:                               OPERATIVE REPORT   PREOPERATIVE DIAGNOSIS:  Hematoma of the left groin with expanding  hematoma.   POSTOPERATIVE DIAGNOSIS:  Hematoma of the left groin with expanding  hematoma.   PROCEDURE:  Exploration of left groin with evacuation of hematoma and  repair of left common femoral artery.   SURGEON:  Di Kindle. Edilia Bo, MD   ASSISTANT:  Wilmon Arms, PA   ANESTHESIA:  General.   INDICATIONS:  This is a 55 year old gentleman who had undergone cardiac  catheterization earlier today and developed a delayed hematoma in the  left groin.  This was expanding and it was felt that exploration was  indicated in order to evacuate the hematoma to relieve the pressure, as  he was having significant pain and also to be sure there was no active  bleeding.   TECHNIQUE:  The patient was taken to the operating room and received a  general anesthetic.  The lower abdomen and groin was prepped and draped  in the usual sterile fashion.  An oblique incision was made well below  his colostomy and the dissection carried down to the common femoral  artery.  The artery was controlled with a vessel loop and then the  dissection was continued proximally and the hole in the artery was  identified essentially right at the level of the inguinal ligament.  I  was able to repair this with a 5-0 Prolene suture.  Next, the large  hematoma which I had dissected down into his scrotum, this clot was all  evacuated.  The wound was then irrigated with copious amounts of saline  and two #19 Blake drains were placed.  The wound was then closed with a  deep layer of 2-0 Vicryl, the  subcutaneous layer with 3-0 Vicryl, and  the skin closed with a 4-0 subcuticular stitch.  Sterile dressing was  applied.  The patient tolerated the procedure well and was transferred  to the recovery room in satisfactory condition.  All needle and sponge  counts were correct.      Di Kindle. Edilia Bo, M.D.  Electronically Signed     CSD/MEDQ  D:  08/11/2008  T:  08/12/2008  Job:  [id number]

## 2010-10-02 NOTE — Cardiovascular Report (Signed)
NAMEAJ, CRUNKLETON               ACCOUNT NO.:  [id number]   MEDICAL RECORD NO.:  [id number]           PATIENT TYPE:   LOCATION:                                 FACILITY:   PHYSICIAN:  Bruce R. Juanda Chance, MD, [facility]   DATE OF BIRTH:   DATE OF PROCEDURE:  08/11/2008  DATE OF DISCHARGE:                            CARDIAC CATHETERIZATION   ADDENDUM   He had a first colostomy performed in June 2008.      Bruce Elvera Lennox Juanda Chance, MD, [facility]     BRB/MEDQ  D:  08/08/2009  T:  08/09/2009  Job:  [id number]   cc:   Arelia Longest, RHIA, CCS
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
## 2010-10-02 NOTE — H&P (Signed)
[name], [name]               ACCOUNT NO.:  [id number]   MEDICAL RECORD NO.:  [id number]          PATIENT TYPE:  INP   LOCATION:  1233                         FACILITY:  [facility]   PHYSICIAN:  Thomasenia Bottoms, MDDATE OF BIRTH:  05-22-1955   DATE OF ADMISSION:  08/18/2007  DATE OF DISCHARGE:                              HISTORY & PHYSICAL   CHIEF COMPLAINT:  MRSA positive blood culture.   HISTORY OF PRESENT ILLNESS:  [name] is a paraplegic 55 year old who  lives in a nursing home who has had trouble with severe chills and  decreased appetite for approximately one week.  The patient has a  history of recurrent MRSA bacteremia.  Blood cultures were drawn in the  nursing home and were found to be positive for MRSA.  Hence, the patient  was sent to the emergency department for admission.  The patient, other  than diminished appetite, denies any other pain or discomfort to me.  However, he was complaining of shoulder pain earlier.  The patient's  past medical history is significant for recurrent MRSA bacteremia per  his report.  He mostly has been treated at [facility] over the last several  years.  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  That was his last admission to this  hospital.  The patient tells me he has had surgery on his decubitus at  [facility].  He is status post colostomy in June 2008 at [facility].  His  other medical history is significant for hypertension, anemia, diabetes  mellitus type 2, hyperlipidemia.  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   MEDICATIONS:  On arrival per the nursing home records  include:  1. Lantus insulin 10 units subcutaneous every night.  2. Sliding scale insulin Humulin R.  3. Glucerna at lunch.  4. Phenergan p.r.n.  5. MiraLax 17 g with 8 ounces of liquid daily.  6. Simvastatin 40 mg daily.  7. Zestril 5 mg daily.  8. Multivitamin daily.  9. Vitamin C 500 mg b.i.d.  10.Verapamil 120 mg 1/2 tablet 3 times a day.  11.Colace 100 mg t.i.d.  12.Reglan 5 mg q.a.c. and every night.  13.Glucotrol 2.5 mg p.o. daily.  14.Oxycodone 15 mg p.o. daily.   Of note, the patient is full code for his records.   FAMILY HISTORY:  Unknown.   SOCIAL HISTORY:  The patient lives in a nursing home so is not likely to  smoke cigarettes, drink alcohol, or use any illicit drugs.   REVIEW OF SYSTEMS:  The patient is paraplegic.  He says he has been  completely in his usual state of health other than the diminished  appetite and the chills for the last week.  On arrival, his temperature  was 98.5, blood pressure 120/78, pulse 90, respiratory rate 15, O2  saturations 100% on room air.   PHYSICAL EXAMINATION:  The patient is in no acute distress.  He is pale.  His HEENT exam is normocephalic, atraumatic.  His pupils are equal and  round.  His sclerae is nonicteric.  Oral mucosa moist.  NECK:  Supple.  CARDIAC EXAM:  Is regular rate and rhythm.  LUNGS:  Are clear to auscultation bilaterally with no wheezes, rhonchi,  or rales.  ABDOMEN:  Is soft, nontender, nondistended.  Normoactive bowel sounds.  No masses are appreciated.  He does have a colostomy with brown stool in  the bag.  He has a suprapubic catheter as well.  SKIN:  He has a decubitus on each of his ankles.  They have some sort of  a cream on them and a protective coating. Both appear slightly  macerated, but there is no surrounding erythema.  No foul smell.  Some  mild drainage on the thick barrier pad.  He also has a decubitus of his  sacral area.  There does not appear to be a large opening, and the ulcer  is in  deep vertical fold adjacent but not contiguous with his rectal  area.  It does have a very foul smell, and there is discharge noted on  the gauze.  The skin does not have any significant erythema, however  NEUROLOGICALLY:  The patient is awake, alert, and appropriate.  His  cranial nerves II-XII are intact grossly.  He is paraplegic.   DATA:  His white count is 24.1, hemoglobin 8.7, hematocrit 25.7,  platelet count is 646.  Sodium is 131, potassium 6, chloride 101, BUN  73, creatinine 2.5.  AST is 24.  His right shoulder x-ray reveals no  acute abnormality.  Chest x-ray reveals no acute abnormality.   ASSESSMENT/PLAN:  1. Methicillin-resistant staphylococcus aureus bacteremia.  Will place      the patient on IV vancomycin.  Will obtain his records from [facility].  Would consider an infectious disease consultation.  The      decubitus ulcer apparently has been the source of the MRSA in the      past.  It continues to smell infected.  Would ask for a wound care      consult.  2. Mild hyperkalemia and acute renal failure.  The patient will      receive Kayexalate and IV fluids, and we will monitor this.  We      will hold his ACE inhibitor.  3. Anemia.  He does have a history of anemia.  We will monitor his      hemoglobins while in the hospital.  4. Leukocytosis and thrombocytosis are consistent with bacteremia.  We      will follow.  5. For his hypertension, diabetes and hyperlipidemia.  We will      continue his outpatient regimen with the exception of the ACE      inhibitor.      Thomasenia Bottoms, MD  Electronically Signed     CVC/MEDQ  D:  08/18/2007  T:  08/19/2007  Job:  [id number]   cc:   [name], M.D.

## 2010-10-02 NOTE — Discharge Summary (Signed)
NAMEKUTTER, SCHNEPF               ACCOUNT NO.:  [id number]   MEDICAL RECORD NO.:  [id number]         PATIENT TYPE:  LINP   LOCATION:                               FACILITY:  [facility]   PHYSICIAN:  Herbie Saxon, MDDATE OF BIRTH:  24-May-1955   DATE OF ADMISSION:  08/18/2007  DATE OF DISCHARGE:  09/14/2007                               DISCHARGE SUMMARY   ADDENDUM TO DISCHARGE SUMMARY:  Please add in the medication list OxyIR  5 mg q.4-6 h. p.r.n. for pain.      Herbie Saxon, MD  Electronically Signed     MIO/MEDQ  D:  09/14/2007  T:  09/14/2007  Job:  [id number]

## 2010-10-02 NOTE — Discharge Summary (Signed)
NAMEMARCIO, [name]               ACCOUNT NO.:  [id number]   MEDICAL RECORD NO.:  [id number]          PATIENT TYPE:  INP   LOCATION:  1436                         [facility]ACILITY:  [facility]   PHYSICIAN:  [name], MDDATE OF BIRTH:  Sep 16, 1955   DATE OF ADMISSION:  08/18/2007  DATE OF DISCHARGE:  09/04/2007                               DISCHARGE SUMMARY   ADDENDUM:  This is an addendum to the discharge summary previously  dictated by Dr. Toniann [name] on September 01, 2007, and Dr. Brien [name].   DISCHARGE DIAGNOSES:  Remain the same with addition of  1. Respiratory failure, improved, status post extubation on September 08, 2007.  2. Aortic chambers failed anticoagulation.  3. Acute renal failure, improved.  4. Anemia, stable.  5. Hypertension, stable.  6. Congestive heart failure with systolic dysfunction.  7. Methicillin resistant Staphylococcus aureus bacteremia.  8. Endocarditis.  9. Urinary tract infection.  10.Sacral decubitus.   The patient was transferred to the intensive care unit on September 06, 2007, when he developed respiratory failure.  Anticoagulation had been  stopped and critical care is now recommending splenectomy because of the  risk of postoperative sepsis.  Patient did develop hemorrhage after he  was started on heparin and Coumadin and this has been discontinued.  The  TEE that was done on this admission on September 04, 2007.  This showed that  he had a 2 cm length aortic thrombus which could be a source of embolus  but there were no large valvular vegetations seen.  There is no discrete  evidence that endocarditis is less likely.  The 2-D echocardiogram  on  August 25, 2007, did show that he also had no  vegetations.  The plan is  to continue the patient on antibiotics for a total of 6-week course and  blood cultures to be repeated as an outpatient.   CONDITION ON DISCHARGE:  Stable.   FOLLOW UP:  He will follow up with the nursing home physician, Dr.  Leanord [name], and  [name] as an outpatient.   DIET:  An 1800 calorie ADA, low cholesterol, heart healthy.   ACTIVITY:  Increase slowly as tolerated.  Move patient in bed q.2-3h.,  reduce decubitus risk.   DISCHARGE MEDICATIONS:  1. Vancomycin 1 g IV q.12h. x2 more weeks.  2. Ventolin inhaler 2 puffs q.6h. p.r.n.  3. Zestril 2.5 mg daily.  4. Lasix 20 mg daily.  5. KCl 10 mEq daily.  6. Xanax 0.5 mg p.o. q.8h. p.r.n. for insomnia.  7. Lantus insulin 8 units subcu nightly.  8. Glucotrol 2.5 mg alternate daily.  9. Oxycodone 15 mg twice daily.  10.Reglan 5 mg q.6h.  11.Colace 100 mg t.i.d.  12.Verapamil 60 mg t.i.d.  13.Vitamin C 500 mg b.i.d.  14.Multivitamin one tablet daily.  15.Simvastatin 40 mg daily.  16.MiraLax 17 g daily p.r.n.  17.Phenergan 12.5 mg IV or p.o. q.8h. p.r.n.  18.Glucerna or Ensure one can t.i.d.  19.Sliding scale Novolin coverage sensitive scale.  20.Lantus insulin 8 units subcu nightly.  21.Iron sulfate 325 mg b.i.d.  22.Tylenol 650 mg q6hr prn for fever   PHYSICAL EXAMINATION:  VITAL SIGNS:  Temperature 98, pulse 80,  respiratory rate 20, blood pressure 130/81.  GENERAL APPEARANCE:  This is a middle-aged man, paraplegic.  HEENT:  Pale, not jaundiced.  NECK:  Supple.  CARDIOVASCULAR:  Heart sounds 1 and 2 regular.  ABDOMEN:  Soft and nontender.  Functional colostomy.  No organomegaly.  No renal or inguinal  tenderness.  NEUROLOGIC:  He is alert and oriented to time, place and person.  Peripheral pulses reduced, he is paraplegic.  There is no pedal edema.  No skin rash.  No cyanosis or clubbing.   AVAILABLE LABS:  On September 14, 2007, chemistry shows sodium 135,  potassium 4.3, chloride 108, bicarbonate 24, glucose 123, BUN 26,  creatinine 1.  INR is 1.3.  A wbc is 12.9, hematocrit 26, platelet count  842.  The patient is to have a repeat CBC and BMP in three days as an  outpatient.  The patient,s illness ,tests,treatment and discharge plans were   extensively discussed with patient and his mother.they verbalise  understanding.      Paul Saxon, MD  Electronically Signed     MIO/MEDQ  D:  09/14/2007  T:  09/14/2007  Job:  [id number]

## 2010-10-02 NOTE — Discharge Summary (Signed)
NAMECAIDENCE, HIGASHI               ACCOUNT NO.:  [id number]   MEDICAL RECORD NO.:  [id number]          PATIENT TYPE:  INP   LOCATION:  1411                         FACILITY:  [facility]   PHYSICIAN:  Eduard Clos, MDDATE OF BIRTH:  Feb 08, 1956   DATE OF ADMISSION:  11/18/2007  DATE OF DISCHARGE:  11/25/2007                               DISCHARGE SUMMARY   INTERIM DISCHARGE SUMMARY   COURSE IN THE HOSPITAL:  A 55 year old male with known history of  paraplegia, chronic sacral decubitus ulcer, recent spontaneous  subcapsular rupture of the spleen with embolization.  Recent MRSA  bacteremia 2 months ago.  Presented to the ER complaining of nausea and  vomiting.  Initially UA showed features of possible UTI.  The patient  was admitted and started on IV fluids.  Urine cultures were obtained  which did not show any growth.  The patient had a previous episode of  splenic rupture.  CAT scan was done.  It showed a intrasplenic fluid  collection.  We discussed this with the interventional radiologist, who  advised a CT angiogram, which showed part of the spleen remained  perfused due to collateral flow.  The huge intrasplenic fluid collection  is in the location of the previous injury.  This may represent an  infected hematoma, given that the patient is status post catheter  embolization of the splenic artery.  Additional unusual fluid collection  medial to the stomach which appears to be partially in the wall of the  stomach.  This does appear to be external but may be submucosal in the  patient.  The patient underwent CT guided drainage of the abscess of the  spleen, which did have a lot of pus, and the patient had a drain placed.  The patient's blood cultures grew MRSA.  Infectious disease was  consulted.  The patient was placed on vancomycin and rifampin.  Infectious disease consult was provided by Dr. Ninetta Lights who felt that the  the patient's previous aortic graft done for aortic trauma  in 1970 may  be infected and will need a CAT scan for that.  At this time, CAT scan  of the abdomen, pelvis and CT chest has been arranged for further follow  up on his splenic abscess, and to follow up on the patient's aortic  graft in the chest area to see if we can fix it.  Further  recommendations will follow after these tests are done.   FINAL DIAGNOSES:  1. Methicillin-resistant Staphylococcus aureus bacteremia.  2. Splenic abscess status post drainage.  3. 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  4. Malnutrition.  5. Chronic sacral decubitus ulcer.  6. Diabetes mellitus type 2.  7. Hypertension.   PLAN:  At this time, the patient has been arranged for a repeat CT of  the abdomen and pelvis to see the progression of his splenic abscess and  a CT of his chest to see if the aortic graft is infected.  The patient  is being premedicated with steroids due to his history of allergy to  dye.  The patient is on vancomycin and rifampin.  Further  recommendations and discharging medications will be done by the  discharging M.D. at the time of discharge.      Eduard Clos, MD  Electronically Signed     ANK/MEDQ  D:  11/25/2007  T:  11/25/2007  Job:  [id number]

## 2010-10-02 NOTE — Discharge Summary (Signed)
NAMEJARMEL, [name]               ACCOUNT NO.:  [id number]   MEDICAL RECORD NO.:  [id number]          PATIENT TYPE:  INP   LOCATION:  1411                         [facility]ACILITY:  [facility]   PHYSICIAN:  Hillery Aldo, M.D.   DATE OF BIRTH:  05/05/1956   DATE OF ADMISSION:  11/18/2007  DATE OF DISCHARGE:  12/03/2007                               DISCHARGE SUMMARY   ADDENDUM:  For complete list of the discharge diagnoses, discharge medications,  condition on discharge, recommendations, and hospital course, please see  the previously dictated discharge summary done by Dr. Lavera Guise and Dr.  Toniann Fail.   NOTE:  The patient is stable for discharge after having his abscess  drain replaced by interventional radiology.  Again, the splenic drain  will need to be flushed 3 times a day, and the output less than 10 mL in  24 hours before it can be removed.  At such time, repeat CT scan of the  spleen area should be obtained, and the patient should be scheduled to  have the drain removed by the [facility].  Additionally, the infectious disease consultants did recommend lifelong  p.o. rifampin as well as either doxycycline 100 mg b.i.d. or Bactrim DS  1 b.i.d. once he completes therapy with vancomycin.   FINAL DISCHARGE DIAGNOSES:  1. 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  2. Methicillin-resistant Staphylococcus aureus splenic abscess.   DISPOSITION:  The patient is stable for discharge to skilled nursing  facility today.      Hillery Aldo, M.D.  Electronically Signed     CR/MEDQ  D:  12/03/2007  T:  12/03/2007  Job:  [id number]
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
## 2010-10-02 NOTE — Cardiovascular Report (Signed)
NAME:  [name], [name]                    ACCOUNT NO.:   MEDICAL RECORD NO.:  [id number]           PATIENT TYPE:   LOCATION:                                 FACILITY:   PHYSICIAN:  Bruce R. Juanda Chance, MD, [facility]   DATE OF BIRTH:   DATE OF PROCEDURE:  08/11/2008  DATE OF DISCHARGE:                            CARDIAC CATHETERIZATION   ADDENDUM   Family requested that we make this correction.  The third sentence which  states he required a colostomy at that time and then it said he recently  had a revision of the colostomy at [facility], so if you can leave out both  those two sentences and in its place just put this sentence, he had a  colostomy performed in June of 2008, so that if you can send that copy  to me and a copy to [facility], she will get that to the patient and  make that correction, the patient requested this.  Her number by the way  is [phone] if you have any questions, Arelia Longest.      Bruce Elvera Lennox Juanda Chance, MD, [facility]     BRB/MEDQ  D:  08/05/2009  T:  08/05/2009  Job:  [id number]   cc:   Mady Gemma. Allender, RHIA, CCS
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
## 2010-10-02 NOTE — Cardiovascular Report (Signed)
[name], [name]               ACCOUNT NO.:  [id number]   MEDICAL RECORD NO.:  [id number]          PATIENT TYPE:  INP   LOCATION:  2602                         FACILITY:  [facility]   PHYSICIAN:  Rollene Rotunda, MD, FACCDATE OF BIRTH:  [birth year]   DATE OF PROCEDURE:  08/09/2008  DATE OF DISCHARGE:                            CARDIAC CATHETERIZATION   PRIMARY CARE PHYSICIAN:  [name], MD   PROCEDURE:  Left heart catheterization/coronary arteriography.   INDICATIONS:  Evaluate patient with non-Q-wave myocardial infarction and  chest pain.   PROCEDURE NOTE:  Left heart catheterization was performed via left  femoral artery.  The artery was cannulated using anterior wall puncture.  A #5-French arterial sheath was initially inserted though I exchanged to  a 6-French sheath for better coronary filling.  I did use a left 4.5  guide catheter for one of my LAD shots.  Otherwise, I used preformed  Judkins catheters.  The patient tolerated the procedure well and left  lab in stable condition.   RESULTS:  Hemodynamics:  LV 146/13, AO 155/83.  Coronaries:  Left main  was normal.  The LAD had proximal long 80% stenosis within previously  stented area.  There was a long apical 90% stenosis.  First diagonal was  large with proximal long subtotal stenosis.  The circumflex in the AV  groove was normal.  First obtuse marginal was large with ostial 25%  stenosis.  Posterolateral 1 was moderate sized and normal.  Posterolateral 2 was small and normal.  The right coronary artery was  occluded proximally within the previously stented area.  There was scant  left-to-right collaterals.  The LV was not injected but I crossed the  aortic valve for pressures.   CONCLUSION:  Severe 2-vessel coronary artery disease.   PLAN:  The patient will have percutaneous revascularization of the LAD  stent.  This will be a staged procedure secondary to renal  insufficiency.  He will also need prophylaxis for  dye allergy which he  received prior to this procedure today.       Rollene Rotunda, MD, [facility]  Electronically Signed     JH/MEDQ  D:  08/09/2008  T:  08/10/2008  Job:  [id number]   cc:   [name], M.D.
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
## 2010-11-15 ENCOUNTER — Encounter: Payer: Self-pay | Admitting: Internal Medicine

## 2010-11-15 ENCOUNTER — Ambulatory Visit (INDEPENDENT_AMBULATORY_CARE_PROVIDER_SITE_OTHER): Payer: Medicare Other | Admitting: Internal Medicine

## 2010-11-15 VITALS — BP 128/86 | HR 64

## 2010-11-15 DIAGNOSIS — R112 Nausea with vomiting, unspecified: Secondary | ICD-10-CM

## 2010-11-15 DIAGNOSIS — K219 Gastro-esophageal reflux disease without esophagitis: Secondary | ICD-10-CM

## 2010-11-15 NOTE — Patient Instructions (Addendum)
Gastric Emptying Scan with solid phase scheduled at [facility] for 12/04/10 8:00 am arrive at 7:45 am Nothing to eat or drink after midnight     Increase Pantoprazole (Protonix) to 40 mg twice daily Take Zofran 4 mg every 6 hours running

## 2010-11-16 ENCOUNTER — Encounter: Payer: Self-pay | Admitting: Internal Medicine
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
## 2010-11-19 ENCOUNTER — Telehealth: Payer: Self-pay | Admitting: Internal Medicine

## 2010-11-19 NOTE — Telephone Encounter (Signed)
Left message for pt to call back  °

## 2010-11-19 NOTE — Telephone Encounter (Signed)
Spoke with pts mother and she has already figured out the answer to the medication question that she had.

## 2010-12-04 ENCOUNTER — Encounter (HOSPITAL_COMMUNITY)
Admission: RE | Admit: 2010-12-04 | Discharge: 2010-12-04 | Disposition: A | Discharge status: Home / Self Care | Payer: Medicare Other | Source: Ambulatory Visit | Attending: Internal Medicine | Admitting: Internal Medicine

## 2010-12-04 ENCOUNTER — Encounter (HOSPITAL_COMMUNITY): Payer: Self-pay

## 2010-12-04 ENCOUNTER — Telehealth: Payer: Self-pay

## 2010-12-04 DIAGNOSIS — R112 Nausea with vomiting, unspecified: Secondary | ICD-10-CM

## 2010-12-04 DIAGNOSIS — R110 Nausea: Secondary | ICD-10-CM | POA: Insufficient documentation

## 2010-12-04 DIAGNOSIS — E119 Type 2 diabetes mellitus without complications: Secondary | ICD-10-CM | POA: Insufficient documentation

## 2010-12-04 MED ORDER — TECHNETIUM TC 99M SULFUR COLLOID
2.1000 | Freq: Once | INTRAVENOUS | Status: AC | PRN
  Administered 2010-12-04: 2.1 via ORAL

## 2010-12-04 NOTE — Telephone Encounter (Signed)
Spoke with pts mother and she is aware of results per Dr. Marina Goodell. Pt has not seen an ENT in the past according to the mother. Pt vomited this morning prior to the GES, told his mother is was what he ate yesterday. Still having some nausea and vomiting.

## 2010-12-04 NOTE — Telephone Encounter (Signed)
Message copied by Michele Mcalpine on Tue Dec 04, 2010  2:49 PM ------      Message from: Hilarie Fredrickson      Created: Tue Dec 04, 2010  1:31 PM       Let pt  / pt's mom know that the emptying scan was ok. See how he has been doing since OV.Marland KitchenMarland KitchenAsk her if he has ever had an ENT evaluation for his N/V, given the positional nature of the symptoms.

## 2010-12-04 NOTE — Telephone Encounter (Signed)
RECOMMEND ENT REFFERAL  FOR "POSITIONAL NAUSEA AND VOMITING". IF THEY DON'T HAVE AN ENT, I'D LIKE THEM TO SEE DR Suzanna Obey

## 2010-12-04 NOTE — Telephone Encounter (Signed)
Message copied by Michele Mcalpine on Tue Dec 04, 2010  2:58 PM ------      Message from: Hilarie Fredrickson      Created: Tue Dec 04, 2010  1:31 PM       Let pt  / pt's mom know that the emptying scan was ok. See how he has been doing since OV.Marland KitchenMarland KitchenAsk her if he has ever had an ENT evaluation for his N/V, given the positional nature of the symptoms.

## 2010-12-05 NOTE — Telephone Encounter (Signed)
Spoke with pt, faxed over records to Dr. Tona Sensing office. Waiting to hear back regarding appt for the pt.

## 2010-12-05 NOTE — Telephone Encounter (Signed)
Pt scheduled to see Dr. Jearld Fenton 12/18/10@3 :10pm, pt to arrive early. Left message for pt to call back about appt.

## 2010-12-06 NOTE — Telephone Encounter (Signed)
Spoke with pt and he states he changed the appt to August 8th.

## 2010-12-27 ENCOUNTER — Inpatient Hospital Stay (HOSPITAL_COMMUNITY)
Admission: EM | Admit: 2010-12-27 | Discharge: 2011-01-01 | DRG: 202 | Disposition: A | Discharge status: Skilled Nursing Facility | Payer: Medicare Other | Attending: Internal Medicine | Admitting: Internal Medicine

## 2010-12-27 ENCOUNTER — Emergency Department (HOSPITAL_COMMUNITY): Payer: Medicare Other

## 2010-12-27 DIAGNOSIS — B9789 Other viral agents as the cause of diseases classified elsewhere: Secondary | ICD-10-CM | POA: Diagnosis present

## 2010-12-27 DIAGNOSIS — Z79899 Other long term (current) drug therapy: Secondary | ICD-10-CM

## 2010-12-27 DIAGNOSIS — M109 Gout, unspecified: Secondary | ICD-10-CM | POA: Diagnosis present

## 2010-12-27 DIAGNOSIS — I129 Hypertensive chronic kidney disease with stage 1 through stage 4 chronic kidney disease, or unspecified chronic kidney disease: Secondary | ICD-10-CM | POA: Diagnosis present

## 2010-12-27 DIAGNOSIS — E119 Type 2 diabetes mellitus without complications: Secondary | ICD-10-CM | POA: Diagnosis present

## 2010-12-27 DIAGNOSIS — N390 Urinary tract infection, site not specified: Secondary | ICD-10-CM | POA: Diagnosis present

## 2010-12-27 DIAGNOSIS — G4700 Insomnia, unspecified: Secondary | ICD-10-CM | POA: Diagnosis present

## 2010-12-27 DIAGNOSIS — L89609 Pressure ulcer of unspecified heel, unspecified stage: Secondary | ICD-10-CM | POA: Diagnosis present

## 2010-12-27 DIAGNOSIS — Z933 Colostomy status: Secondary | ICD-10-CM

## 2010-12-27 DIAGNOSIS — F329 Major depressive disorder, single episode, unspecified: Secondary | ICD-10-CM | POA: Diagnosis present

## 2010-12-27 DIAGNOSIS — B961 Klebsiella pneumoniae [K. pneumoniae] as the cause of diseases classified elsewhere: Secondary | ICD-10-CM | POA: Diagnosis present

## 2010-12-27 DIAGNOSIS — E860 Dehydration: Secondary | ICD-10-CM | POA: Diagnosis present

## 2010-12-27 DIAGNOSIS — Z9351 Cutaneous-vesicostomy status: Secondary | ICD-10-CM

## 2010-12-27 DIAGNOSIS — Z9861 Coronary angioplasty status: Secondary | ICD-10-CM

## 2010-12-27 DIAGNOSIS — K219 Gastro-esophageal reflux disease without esophagitis: Secondary | ICD-10-CM | POA: Diagnosis present

## 2010-12-27 DIAGNOSIS — N189 Chronic kidney disease, unspecified: Secondary | ICD-10-CM | POA: Diagnosis present

## 2010-12-27 DIAGNOSIS — N179 Acute kidney failure, unspecified: Secondary | ICD-10-CM | POA: Diagnosis present

## 2010-12-27 DIAGNOSIS — M549 Dorsalgia, unspecified: Secondary | ICD-10-CM | POA: Diagnosis present

## 2010-12-27 DIAGNOSIS — Z8614 Personal history of Methicillin resistant Staphylococcus aureus infection: Secondary | ICD-10-CM

## 2010-12-27 DIAGNOSIS — Z7902 Long term (current) use of antithrombotics/antiplatelets: Secondary | ICD-10-CM

## 2010-12-27 DIAGNOSIS — I2510 Atherosclerotic heart disease of native coronary artery without angina pectoris: Secondary | ICD-10-CM | POA: Diagnosis present

## 2010-12-27 DIAGNOSIS — Z87440 Personal history of urinary (tract) infections: Secondary | ICD-10-CM

## 2010-12-27 DIAGNOSIS — I10 Essential (primary) hypertension: Secondary | ICD-10-CM | POA: Diagnosis present

## 2010-12-27 DIAGNOSIS — J209 Acute bronchitis, unspecified: Principal | ICD-10-CM | POA: Diagnosis present

## 2010-12-27 DIAGNOSIS — K922 Gastrointestinal hemorrhage, unspecified: Secondary | ICD-10-CM | POA: Diagnosis present

## 2010-12-27 DIAGNOSIS — IMO0002 Reserved for concepts with insufficient information to code with codable children: Secondary | ICD-10-CM

## 2010-12-27 DIAGNOSIS — B952 Enterococcus as the cause of diseases classified elsewhere: Secondary | ICD-10-CM | POA: Diagnosis present

## 2010-12-27 DIAGNOSIS — Z7982 Long term (current) use of aspirin: Secondary | ICD-10-CM

## 2010-12-27 DIAGNOSIS — I252 Old myocardial infarction: Secondary | ICD-10-CM

## 2010-12-27 DIAGNOSIS — L8992 Pressure ulcer of unspecified site, stage 2: Secondary | ICD-10-CM | POA: Diagnosis present

## 2010-12-27 DIAGNOSIS — L89309 Pressure ulcer of unspecified buttock, unspecified stage: Secondary | ICD-10-CM | POA: Diagnosis present

## 2010-12-27 DIAGNOSIS — G8929 Other chronic pain: Secondary | ICD-10-CM | POA: Diagnosis present

## 2010-12-27 DIAGNOSIS — G8220 Paraplegia, unspecified: Secondary | ICD-10-CM | POA: Diagnosis present

## 2010-12-27 DIAGNOSIS — F3289 Other specified depressive episodes: Secondary | ICD-10-CM | POA: Diagnosis present

## 2010-12-27 LAB — DIFFERENTIAL
Band Neutrophils: 0 % (ref 0–10)
Basophils Absolute: 0 10*3/uL (ref 0.0–0.1)
Basophils Relative: 0 % (ref 0–1)
Eosinophils Absolute: 0.2 10*3/uL (ref 0.0–0.7)
Eosinophils Relative: 2 % (ref 0–5)
Lymphs Abs: 2.5 10*3/uL (ref 0.7–4.0)
Myelocytes: 1 %
Promyelocytes Absolute: 0 %

## 2010-12-27 LAB — CBC
MCH: 27.9 pg (ref 26.0–34.0)
Platelets: 454 10*3/uL — ABNORMAL HIGH (ref 150–400)
RBC: 3.8 MIL/uL — ABNORMAL LOW (ref 4.22–5.81)
RDW: 15 % (ref 11.5–15.5)
WBC: 9.8 10*3/uL (ref 4.0–10.5)

## 2010-12-27 LAB — BASIC METABOLIC PANEL
BUN: 43 mg/dL — ABNORMAL HIGH (ref 6–23)
CO2: 19 mEq/L (ref 19–32)
Glucose, Bld: 133 mg/dL — ABNORMAL HIGH (ref 70–99)
Sodium: 133 mEq/L — ABNORMAL LOW (ref 135–145)

## 2010-12-27 LAB — URINALYSIS, ROUTINE W REFLEX MICROSCOPIC
Glucose, UA: 100 mg/dL — AB
pH: 5.5 (ref 5.0–8.0)

## 2010-12-27 LAB — MRSA PCR SCREENING: MRSA by PCR: NEGATIVE

## 2010-12-27 LAB — URINE MICROSCOPIC-ADD ON

## 2010-12-27 LAB — GLUCOSE, CAPILLARY: Glucose-Capillary: 172 mg/dL — ABNORMAL HIGH (ref 70–99)
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
## 2010-12-28 LAB — GLUCOSE, CAPILLARY
Glucose-Capillary: 206 mg/dL — ABNORMAL HIGH (ref 70–99)
Glucose-Capillary: 207 mg/dL — ABNORMAL HIGH (ref 70–99)

## 2010-12-28 LAB — CBC
Hemoglobin: 10.3 g/dL — ABNORMAL LOW (ref 13.0–17.0)
RBC: 3.69 MIL/uL — ABNORMAL LOW (ref 4.22–5.81)

## 2010-12-28 LAB — BASIC METABOLIC PANEL
CO2: 23 mEq/L (ref 19–32)
Chloride: 103 mEq/L (ref 96–112)
Glucose, Bld: 178 mg/dL — ABNORMAL HIGH (ref 70–99)
Potassium: 4.3 mEq/L (ref 3.5–5.1)
Sodium: 135 mEq/L (ref 135–145)

## 2010-12-28 LAB — URINE CULTURE: Culture  Setup Time: 201208100148

## 2010-12-29 LAB — GLUCOSE, CAPILLARY
Glucose-Capillary: 182 mg/dL — ABNORMAL HIGH (ref 70–99)
Glucose-Capillary: 205 mg/dL — ABNORMAL HIGH (ref 70–99)
Glucose-Capillary: 213 mg/dL — ABNORMAL HIGH (ref 70–99)

## 2010-12-29 LAB — CBC
HCT: 33.3 % — ABNORMAL LOW (ref 39.0–52.0)
Hemoglobin: 9.9 g/dL — ABNORMAL LOW (ref 13.0–17.0)
RDW: 15.1 % (ref 11.5–15.5)
WBC: 8.3 10*3/uL (ref 4.0–10.5)

## 2010-12-29 LAB — BASIC METABOLIC PANEL
BUN: 29 mg/dL — ABNORMAL HIGH (ref 6–23)
Chloride: 108 mEq/L (ref 96–112)
GFR calc Af Amer: 55 mL/min — ABNORMAL LOW (ref >60)
Potassium: 3.7 mEq/L (ref 3.5–5.1)

## 2010-12-30 LAB — BASIC METABOLIC PANEL
Calcium: 8.8 mg/dL (ref 8.4–10.5)
Chloride: 112 mEq/L (ref 96–112)
Creatinine, Ser: 1.46 mg/dL — ABNORMAL HIGH (ref 0.50–1.35)
GFR calc Af Amer: >60 mL/min (ref >60)
GFR calc non Af Amer: 51 mL/min — ABNORMAL LOW (ref >60)

## 2010-12-30 LAB — GLUCOSE, CAPILLARY: Glucose-Capillary: 189 mg/dL — ABNORMAL HIGH (ref 70–99)

## 2010-12-30 LAB — CBC
MCH: 27.8 pg (ref 26.0–34.0)
MCHC: 30.6 g/dL (ref 30.0–36.0)
MCV: 90.8 fL (ref 78.0–100.0)
Platelets: 400 10*3/uL (ref 150–400)
RDW: 15.6 % — ABNORMAL HIGH (ref 11.5–15.5)
WBC: 8.6 10*3/uL (ref 4.0–10.5)

## 2010-12-31 LAB — GLUCOSE, CAPILLARY
Glucose-Capillary: 101 mg/dL — ABNORMAL HIGH (ref 70–99)
Glucose-Capillary: 216 mg/dL — ABNORMAL HIGH (ref 70–99)
Glucose-Capillary: 227 mg/dL — ABNORMAL HIGH (ref 70–99)

## 2011-01-01 LAB — GLUCOSE, CAPILLARY: Glucose-Capillary: 120 mg/dL — ABNORMAL HIGH (ref 70–99)
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
## 2011-02-11 LAB — DIFFERENTIAL
Basophils Absolute: 0
Basophils Relative: 0
Eosinophils Relative: 1
Monocytes Absolute: 1.3 — ABNORMAL HIGH
Neutro Abs: 20.5 — ABNORMAL HIGH

## 2011-02-11 LAB — CBC
HCT: 25.7 — ABNORMAL LOW
Hemoglobin: 8.7 — ABNORMAL LOW
MCV: 72.8 — ABNORMAL LOW
Platelets: 646 — ABNORMAL HIGH
RBC: 3.53 — ABNORMAL LOW
WBC: 24.1 — ABNORMAL HIGH

## 2011-02-11 LAB — BASIC METABOLIC PANEL
BUN: 72 — ABNORMAL HIGH
Chloride: 101
Glucose, Bld: 219 — ABNORMAL HIGH
Potassium: 5.6 — ABNORMAL HIGH

## 2011-02-11 LAB — COMPREHENSIVE METABOLIC PANEL
Albumin: 2.3 — ABNORMAL LOW
Alkaline Phosphatase: 121 — ABNORMAL HIGH
BUN: 73 — ABNORMAL HIGH
CO2: 18 — ABNORMAL LOW
Chloride: 101
GFR calc non Af Amer: 28 — ABNORMAL LOW
Potassium: 6 — ABNORMAL HIGH
Total Bilirubin: 0.7

## 2011-02-11 LAB — URINE CULTURE: Colony Count: >=100000

## 2011-02-11 LAB — URINALYSIS, ROUTINE W REFLEX MICROSCOPIC
Glucose, UA: NEGATIVE
Ketones, ur: NEGATIVE
Protein, ur: 30 — AB
Urobilinogen, UA: 0.2

## 2011-02-11 LAB — CULTURE, BLOOD (ROUTINE X 2): Culture: NO GROWTH

## 2011-02-11 LAB — URINE MICROSCOPIC-ADD ON

## 2011-02-12 LAB — TYPE AND SCREEN
ABO/RH(D): O NEG
Antibody Screen: NEGATIVE

## 2011-02-12 LAB — BASIC METABOLIC PANEL
BUN: 28 — ABNORMAL HIGH
BUN: 29 — ABNORMAL HIGH
BUN: 30 — ABNORMAL HIGH
BUN: 38 — ABNORMAL HIGH
BUN: 38 — ABNORMAL HIGH
BUN: 40 — ABNORMAL HIGH
BUN: 61 — ABNORMAL HIGH
BUN: 26 — ABNORMAL HIGH
BUN: 33 — ABNORMAL HIGH
BUN: 33 — ABNORMAL HIGH
BUN: 35 — ABNORMAL HIGH
BUN: 35 — ABNORMAL HIGH
BUN: 40 — ABNORMAL HIGH
BUN: 41 — ABNORMAL HIGH
BUN: 45 — ABNORMAL HIGH
BUN: 45 — ABNORMAL HIGH
CO2: 14 — ABNORMAL LOW
CO2: 16 — ABNORMAL LOW
CO2: 17 — ABNORMAL LOW
CO2: 19
CO2: 19
CO2: 25
CO2: 23
CO2: 24
CO2: 24
CO2: 25
CO2: 25
CO2: 25
Calcium: 7.2 — ABNORMAL LOW
Calcium: 7.5 — ABNORMAL LOW
Calcium: 7.5 — ABNORMAL LOW
Calcium: 7.9 — ABNORMAL LOW
Calcium: 7.9 — ABNORMAL LOW
Calcium: 8 — ABNORMAL LOW
Calcium: 8.2 — ABNORMAL LOW
Calcium: 8.5
Calcium: 8.3 — ABNORMAL LOW
Calcium: 8.4
Calcium: 8.6
Calcium: 8.8
Calcium: 8.8
Calcium: 8.8
Calcium: 8.9
Calcium: 9
Calcium: 9
Chloride: 100
Chloride: 102
Chloride: 104
Chloride: 105
Chloride: 106
Chloride: 107
Chloride: 105
Chloride: 105
Chloride: 107
Creatinine, Ser: 1.03
Creatinine, Ser: 1.77 — ABNORMAL HIGH
Creatinine, Ser: 2.13 — ABNORMAL HIGH
Creatinine, Ser: 2.14 — ABNORMAL HIGH
Creatinine, Ser: 2.65 — ABNORMAL HIGH
Creatinine, Ser: 2.86 — ABNORMAL HIGH
Creatinine, Ser: 2.88 — ABNORMAL HIGH
Creatinine, Ser: 1.05
Creatinine, Ser: 1.09
Creatinine, Ser: 1.17
Creatinine, Ser: 1.23
Creatinine, Ser: 1.29
Creatinine, Ser: 1.35
Creatinine, Ser: 1.36
GFR calc Af Amer: 29 — ABNORMAL LOW
GFR calc Af Amer: 31 — ABNORMAL LOW
GFR calc Af Amer: 40 — ABNORMAL LOW
GFR calc Af Amer: 40 — ABNORMAL LOW
GFR calc Af Amer: 55 — ABNORMAL LOW
GFR calc Af Amer: >60
GFR calc Af Amer: >60
GFR calc Af Amer: >60
GFR calc Af Amer: >60
GFR calc Af Amer: >60
GFR calc Af Amer: >60
GFR calc Af Amer: >60
GFR calc Af Amer: >60
GFR calc Af Amer: >60
GFR calc Af Amer: >60
GFR calc non Af Amer: 22 — ABNORMAL LOW
GFR calc non Af Amer: 23 — ABNORMAL LOW
GFR calc non Af Amer: 26 — ABNORMAL LOW
GFR calc non Af Amer: 29 — ABNORMAL LOW
GFR calc non Af Amer: 33 — ABNORMAL LOW
GFR calc non Af Amer: 41 — ABNORMAL LOW
GFR calc non Af Amer: 51 — ABNORMAL LOW
GFR calc non Af Amer: >60
GFR calc non Af Amer: 56 — ABNORMAL LOW
GFR calc non Af Amer: 59 — ABNORMAL LOW
GFR calc non Af Amer: >60
GFR calc non Af Amer: >60
GFR calc non Af Amer: >60
GFR calc non Af Amer: >60
GFR calc non Af Amer: >60
Glucose, Bld: 108 — ABNORMAL HIGH
Glucose, Bld: 115 — ABNORMAL HIGH
Glucose, Bld: 116 — ABNORMAL HIGH
Glucose, Bld: 120 — ABNORMAL HIGH
Glucose, Bld: 130 — ABNORMAL HIGH
Glucose, Bld: 145 — ABNORMAL HIGH
Glucose, Bld: 172 — ABNORMAL HIGH
Glucose, Bld: 207 — ABNORMAL HIGH
Glucose, Bld: 86
Glucose, Bld: 122 — ABNORMAL HIGH
Glucose, Bld: 123 — ABNORMAL HIGH
Glucose, Bld: 132 — ABNORMAL HIGH
Glucose, Bld: 133 — ABNORMAL HIGH
Glucose, Bld: 144 — ABNORMAL HIGH
Potassium: 4.3
Potassium: 4.3
Potassium: 4.7
Potassium: 4.8
Potassium: 5
Potassium: 4.2
Potassium: 4.3
Potassium: 4.9
Sodium: 131 — ABNORMAL LOW
Sodium: 131 — ABNORMAL LOW
Sodium: 132 — ABNORMAL LOW
Sodium: 133 — ABNORMAL LOW
Sodium: 133 — ABNORMAL LOW
Sodium: 134 — ABNORMAL LOW
Sodium: 135
Sodium: 135
Sodium: 138
Sodium: 141

## 2011-02-12 LAB — CBC
HCT: 22.5 — ABNORMAL LOW
HCT: 23 — ABNORMAL LOW
HCT: 24.3 — ABNORMAL LOW
HCT: 29 — ABNORMAL LOW
HCT: 29 — ABNORMAL LOW
HCT: 29.6 — ABNORMAL LOW
HCT: 30.1 — ABNORMAL LOW
HCT: 30.7 — ABNORMAL LOW
HCT: 30.8 — ABNORMAL LOW
HCT: 30.8 — ABNORMAL LOW
HCT: 27.1 — ABNORMAL LOW
HCT: 27.4 — ABNORMAL LOW
HCT: 28.1 — ABNORMAL LOW
HCT: 28.5 — ABNORMAL LOW
HCT: 30.4 — ABNORMAL LOW
HCT: 32 — ABNORMAL LOW
HCT: 34.7 — ABNORMAL LOW
HCT: 35.6 — ABNORMAL LOW
Hemoglobin: 10 — ABNORMAL LOW
Hemoglobin: 10.1 — ABNORMAL LOW
Hemoglobin: 10.3 — ABNORMAL LOW
Hemoglobin: 10.4 — ABNORMAL LOW
Hemoglobin: 7.4 — CL
Hemoglobin: 7.9 — CL
Hemoglobin: 8 — ABNORMAL LOW
Hemoglobin: 8.6 — ABNORMAL LOW
Hemoglobin: 9.6 — ABNORMAL LOW
Hemoglobin: 9.9 — ABNORMAL LOW
Hemoglobin: 10.4 — ABNORMAL LOW
Hemoglobin: 10.5 — ABNORMAL LOW
Hemoglobin: 10.7 — ABNORMAL LOW
Hemoglobin: 10.8 — ABNORMAL LOW
Hemoglobin: 9.1 — ABNORMAL LOW
Hemoglobin: 9.3 — ABNORMAL LOW
Hemoglobin: 9.5 — ABNORMAL LOW
MCHC: 33.6
MCHC: 33.8
MCHC: 34.4
MCHC: 34.4
MCHC: 35.3
MCHC: 35.5
MCHC: 35.7
MCHC: 35.8
MCHC: 30.4
MCHC: 32.9
MCHC: 33.1
MCHC: 33.2
MCHC: 33.2
MCHC: 33.3
MCHC: 33.5
MCHC: 33.6
MCHC: 33.6
MCV: 73.2 — ABNORMAL LOW
MCV: 76.3 — ABNORMAL LOW
MCV: 81.1
MCV: 82.2
MCV: 83.2
MCV: 84.4
MCV: 84.6
MCV: 85.2
MCV: 85.5
MCV: 85.6
MCV: 84.5
MCV: 84.7
MCV: 84.7
MCV: 85.1
MCV: 85.4
Platelets: 363
Platelets: 379
Platelets: 387
Platelets: 391
Platelets: 438 — ABNORMAL HIGH
Platelets: 444 — ABNORMAL HIGH
Platelets: 513 — ABNORMAL HIGH
Platelets: 530 — ABNORMAL HIGH
Platelets: 590 — ABNORMAL HIGH
Platelets: 619 — ABNORMAL HIGH
Platelets: 678 — ABNORMAL HIGH
Platelets: 1022
Platelets: 1042
Platelets: 1108
Platelets: 1219
Platelets: 1229
Platelets: 792 — ABNORMAL HIGH
Platelets: 842 — ABNORMAL HIGH
Platelets: 936
Platelets: 963
Platelets: 979
Platelets: 991
RBC: 2.34 — ABNORMAL LOW
RBC: 2.52 — ABNORMAL LOW
RBC: 2.69 — ABNORMAL LOW
RBC: 3.14 — ABNORMAL LOW
RBC: 3.4 — ABNORMAL LOW
RBC: 3.43 — ABNORMAL LOW
RBC: 3.44 — ABNORMAL LOW
RBC: 3.61 — ABNORMAL LOW
RBC: 3.64 — ABNORMAL LOW
RBC: 3.86 — ABNORMAL LOW
RBC: 3.26 — ABNORMAL LOW
RBC: 3.32 — ABNORMAL LOW
RBC: 3.7 — ABNORMAL LOW
RBC: 3.72 — ABNORMAL LOW
RBC: 3.72 — ABNORMAL LOW
RDW: 16.5 — ABNORMAL HIGH
RDW: 16.6 — ABNORMAL HIGH
RDW: 16.8 — ABNORMAL HIGH
RDW: 17 — ABNORMAL HIGH
RDW: 17.4 — ABNORMAL HIGH
RDW: 17.5 — ABNORMAL HIGH
RDW: 17.5 — ABNORMAL HIGH
RDW: 17.7 — ABNORMAL HIGH
RDW: 18.1 — ABNORMAL HIGH
RDW: 19 — ABNORMAL HIGH
RDW: 16.9 — ABNORMAL HIGH
RDW: 17.1 — ABNORMAL HIGH
RDW: 17.1 — ABNORMAL HIGH
RDW: 17.1 — ABNORMAL HIGH
RDW: 17.2 — ABNORMAL HIGH
RDW: 17.2 — ABNORMAL HIGH
RDW: 17.3 — ABNORMAL HIGH
RDW: 17.3 — ABNORMAL HIGH
RDW: 17.4 — ABNORMAL HIGH
RDW: 17.5 — ABNORMAL HIGH
WBC: 20.3 — ABNORMAL HIGH
WBC: 23.8 — ABNORMAL HIGH
WBC: 24.6 — ABNORMAL HIGH
WBC: 25.1 — ABNORMAL HIGH
WBC: 28 — ABNORMAL HIGH
WBC: 30.6 — ABNORMAL HIGH
WBC: 32.3 — ABNORMAL HIGH
WBC: 33.4 — ABNORMAL HIGH
WBC: 12.9 — ABNORMAL HIGH
WBC: 14.9 — ABNORMAL HIGH
WBC: 16.3 — ABNORMAL HIGH
WBC: 19.1 — ABNORMAL HIGH
WBC: 25.6 — ABNORMAL HIGH

## 2011-02-12 LAB — DIFFERENTIAL
Basophils Absolute: 0.3 — ABNORMAL HIGH
Basophils Absolute: 0
Basophils Relative: 0
Basophils Relative: 0
Eosinophils Absolute: 0
Eosinophils Absolute: 0.8 — ABNORMAL HIGH
Eosinophils Absolute: 0
Lymphocytes Relative: 8 — ABNORMAL LOW
Lymphocytes Relative: 8 — ABNORMAL LOW
Lymphs Abs: 1.8
Lymphs Abs: 2
Lymphs Abs: 2.4
Monocytes Absolute: 1.8 — ABNORMAL HIGH
Monocytes Absolute: 2.2 — ABNORMAL HIGH
Monocytes Absolute: 2.5 — ABNORMAL HIGH
Monocytes Absolute: 0.9
Monocytes Relative: 9
Neutro Abs: 19.8 — ABNORMAL HIGH
Neutro Abs: 26.7 — ABNORMAL HIGH
Neutrophils Relative %: 79 — ABNORMAL HIGH
Neutrophils Relative %: 80 — ABNORMAL HIGH
Neutrophils Relative %: 88 — ABNORMAL HIGH

## 2011-02-12 LAB — PROTIME-INR
INR: 1.4
INR: 1.4
INR: 1.1
INR: 1.2
INR: 1.3
INR: 1.3
Prothrombin Time: 17.2 — ABNORMAL HIGH
Prothrombin Time: 17.3 — ABNORMAL HIGH
Prothrombin Time: 17.5 — ABNORMAL HIGH
Prothrombin Time: 14.7
Prothrombin Time: 15.5 — ABNORMAL HIGH
Prothrombin Time: 15.7 — ABNORMAL HIGH
Prothrombin Time: 16.1 — ABNORMAL HIGH
Prothrombin Time: 17.3 — ABNORMAL HIGH

## 2011-02-12 LAB — BLOOD GAS, ARTERIAL
Acid-base deficit: 9.4 — ABNORMAL HIGH
Acid-base deficit: 2.2 — ABNORMAL HIGH
Bicarbonate: 20.7
Bicarbonate: 22.5
Drawn by: 103701
FIO2: 1
FIO2: 1
O2 Content: 2
O2 Saturation: 98.1
PEEP: 5
PEEP: 5
Patient temperature: 98.6
RATE: 12
TCO2: 14.6
TCO2: 19
TCO2: 19.7
TCO2: 20.2
pCO2 arterial: 31.3 — ABNORMAL LOW
pCO2 arterial: 35.1
pCO2 arterial: 37.1
pCO2 arterial: 47.9 — ABNORMAL HIGH
pH, Arterial: 7.262 — ABNORMAL LOW
pH, Arterial: 7.389
pH, Arterial: 7.423
pH, Arterial: 7.437
pO2, Arterial: 98.6
pO2, Arterial: 118 — ABNORMAL HIGH
pO2, Arterial: 124 — ABNORMAL HIGH
pO2, Arterial: 298 — ABNORMAL HIGH

## 2011-02-12 LAB — PREPARE RBC (CROSSMATCH)

## 2011-02-12 LAB — CULTURE, BLOOD (ROUTINE X 2)
Culture: NO GROWTH
Culture: NO GROWTH

## 2011-02-12 LAB — HEMOGLOBIN AND HEMATOCRIT, BLOOD
HCT: 17.7 — ABNORMAL LOW
HCT: 19.7 — ABNORMAL LOW
Hemoglobin: 6.7 — CL

## 2011-02-12 LAB — FOLATE: Folate: 19.2

## 2011-02-12 LAB — CROSSMATCH
ABO/RH(D): O NEG
Antibody Screen: NEGATIVE

## 2011-02-12 LAB — B-NATRIURETIC PEPTIDE (CONVERTED LAB)
Pro B Natriuretic peptide (BNP): 2110 — ABNORMAL HIGH
Pro B Natriuretic peptide (BNP): 666 — ABNORMAL HIGH

## 2011-02-12 LAB — APTT: aPTT: 41 — ABNORMAL HIGH

## 2011-02-12 LAB — CARDIAC PANEL(CRET KIN+CKTOT+MB+TROPI)
CK, MB: 1.8
CK, MB: 2.1
CK, MB: 1.7
CK, MB: 2
Relative Index: INVALID
Relative Index: INVALID
Relative Index: INVALID
Total CK: 14
Total CK: <7 — ABNORMAL LOW
Troponin I: 0.07 — ABNORMAL HIGH
Troponin I: 0.08 — ABNORMAL HIGH
Troponin I: 0.11 — ABNORMAL HIGH

## 2011-02-12 LAB — COMPREHENSIVE METABOLIC PANEL
ALT: 11
AST: 15
AST: 18
BUN: 27 — ABNORMAL HIGH
CO2: 14 — ABNORMAL LOW
Calcium: 8 — ABNORMAL LOW
Chloride: 110
Creatinine, Ser: 2.73 — ABNORMAL HIGH
Creatinine, Ser: 3.26 — ABNORMAL HIGH
GFR calc Af Amer: 25 — ABNORMAL LOW
GFR calc Af Amer: 30 — ABNORMAL LOW
GFR calc non Af Amer: 25 — ABNORMAL LOW
Sodium: 134 — ABNORMAL LOW
Total Bilirubin: 1.1
Total Protein: 5.3 — ABNORMAL LOW

## 2011-02-12 LAB — PREPARE FRESH FROZEN PLASMA

## 2011-02-12 LAB — VANCOMYCIN, RANDOM
Vancomycin Rm: 27.2
Vancomycin Rm: 38
Vancomycin Rm: 14.5
Vancomycin Rm: 16.2

## 2011-02-12 LAB — SODIUM, URINE, RANDOM: Sodium, Ur: 118

## 2011-02-12 LAB — BODY FLUID CULTURE

## 2011-02-12 LAB — SYNOVIAL CELL COUNT + DIFF, W/ CRYSTALS
Lymphocytes-Synovial Fld: 5
Neutrophil, Synovial: 94 — ABNORMAL HIGH

## 2011-02-12 LAB — CREATININE, URINE, RANDOM: Creatinine, Urine: <10

## 2011-02-12 LAB — OCCULT BLOOD (STOOL CUP TO LAB): Fecal Occult Bld: POSITIVE

## 2011-02-12 LAB — IRON AND TIBC
Iron: <10 — ABNORMAL LOW
UIBC: 106

## 2011-02-12 LAB — VANCOMYCIN, TROUGH
Vancomycin Tr: 15.5
Vancomycin Tr: 28.1

## 2011-02-12 LAB — ANAEROBIC CULTURE

## 2011-02-12 LAB — HEPARIN LEVEL (UNFRACTIONATED): Heparin Unfractionated: 0.13 — ABNORMAL LOW

## 2011-02-12 LAB — PATHOLOGIST SMEAR REVIEW

## 2011-02-12 LAB — VITAMIN B12: Vitamin B-12: 388 (ref 211–911)

## 2011-02-14 LAB — COMPREHENSIVE METABOLIC PANEL
ALT: 10
ALT: 9
AST: 12
AST: 13
AST: 15
Albumin: 2.1 — ABNORMAL LOW
Albumin: 2.3 — ABNORMAL LOW
Albumin: 2.3 — ABNORMAL LOW
Alkaline Phosphatase: 127 — ABNORMAL HIGH
Alkaline Phosphatase: 148 — ABNORMAL HIGH
Alkaline Phosphatase: 82
Alkaline Phosphatase: 82
Alkaline Phosphatase: 92
BUN: 12
BUN: 13
BUN: 14
BUN: 15
BUN: 17
BUN: 19
BUN: 20
CO2: 21
CO2: 26
Calcium: 8.6
Calcium: 9
Chloride: 106
Chloride: 108
Chloride: 109
Chloride: 110
Chloride: 113 — ABNORMAL HIGH
Creatinine, Ser: 0.84
Creatinine, Ser: 0.89
Creatinine, Ser: 1.03
Creatinine, Ser: 1.03
GFR calc Af Amer: >60
GFR calc Af Amer: >60
GFR calc Af Amer: >60
GFR calc non Af Amer: >60
GFR calc non Af Amer: >60
Glucose, Bld: 159 — ABNORMAL HIGH
Glucose, Bld: 213 — ABNORMAL HIGH
Glucose, Bld: 51 — ABNORMAL LOW
Glucose, Bld: 74
Glucose, Bld: 98
Potassium: 3.7
Potassium: 3.8
Potassium: 4
Potassium: 4
Potassium: 4.1
Potassium: 4.2
Sodium: 137
Sodium: 137
Sodium: 138
Total Bilirubin: 0.5
Total Bilirubin: 0.5
Total Bilirubin: 0.6
Total Bilirubin: 0.8
Total Protein: 5.2 — ABNORMAL LOW
Total Protein: 6
Total Protein: 6.1
Total Protein: 6.4

## 2011-02-14 LAB — BASIC METABOLIC PANEL
BUN: 24 — ABNORMAL HIGH
CO2: 25
Calcium: 8.3 — ABNORMAL LOW
Calcium: 8.7
Calcium: 8.8
Creatinine, Ser: 0.93
GFR calc Af Amer: >60
GFR calc Af Amer: >60
GFR calc non Af Amer: >60
GFR calc non Af Amer: >60
Glucose, Bld: 75
Glucose, Bld: 82
Potassium: 3.4 — ABNORMAL LOW
Potassium: 3.8
Sodium: 135
Sodium: 138
Sodium: 140

## 2011-02-14 LAB — CULTURE, BLOOD (ROUTINE X 2): Culture: NO GROWTH

## 2011-02-14 LAB — CBC
HCT: 27.5 — ABNORMAL LOW
HCT: 28.7 — ABNORMAL LOW
HCT: 28.7 — ABNORMAL LOW
HCT: 29.5 — ABNORMAL LOW
HCT: 30.9 — ABNORMAL LOW
HCT: 33.2 — ABNORMAL LOW
HCT: 35.1 — ABNORMAL LOW
Hemoglobin: 11.4 — ABNORMAL LOW
Hemoglobin: 9.1 — ABNORMAL LOW
Hemoglobin: 9.3 — ABNORMAL LOW
Hemoglobin: 9.3 — ABNORMAL LOW
Hemoglobin: 9.5 — ABNORMAL LOW
Hemoglobin: 9.8 — ABNORMAL LOW
MCHC: 32.3
MCHC: 32.6
MCHC: 33.3
MCV: 82.4
MCV: 83
MCV: 83.3
MCV: 83.7
Platelets: 604 — ABNORMAL HIGH
Platelets: 619 — ABNORMAL HIGH
Platelets: 676 — ABNORMAL HIGH
Platelets: 828 — ABNORMAL HIGH
RBC: 3.32 — ABNORMAL LOW
RBC: 3.45 — ABNORMAL LOW
RBC: 3.53 — ABNORMAL LOW
RBC: 3.65 — ABNORMAL LOW
RBC: 3.67 — ABNORMAL LOW
RDW: 16.9 — ABNORMAL HIGH
RDW: 17 — ABNORMAL HIGH
RDW: 17.9 — ABNORMAL HIGH
RDW: 18.3 — ABNORMAL HIGH
RDW: 18.4 — ABNORMAL HIGH
WBC: 10.4
WBC: 10.4
WBC: 11.3 — ABNORMAL HIGH
WBC: 7.2
WBC: 8.8

## 2011-02-14 LAB — CULTURE, ROUTINE-ABSCESS

## 2011-02-14 LAB — DIFFERENTIAL
Basophils Absolute: 0.1
Basophils Relative: 0
Neutro Abs: 11.3 — ABNORMAL HIGH
Neutrophils Relative %: 69

## 2011-02-14 LAB — URINALYSIS, ROUTINE W REFLEX MICROSCOPIC
Nitrite: NEGATIVE
Protein, ur: 30 — AB
Urobilinogen, UA: 0.2

## 2011-02-14 LAB — PROTIME-INR
INR: 1.2
Prothrombin Time: 15

## 2011-02-14 LAB — LIPASE, BLOOD: Lipase: 18

## 2011-02-14 LAB — URINE CULTURE

## 2011-02-14 LAB — VANCOMYCIN, TROUGH: Vancomycin Tr: 20.6 — ABNORMAL HIGH

## 2011-02-14 LAB — CARDIAC PANEL(CRET KIN+CKTOT+MB+TROPI)
Relative Index: INVALID
Total CK: <7 — ABNORMAL LOW

## 2011-02-14 LAB — ANAEROBIC CULTURE

## 2011-02-15 LAB — GLUCOSE, CAPILLARY: Glucose-Capillary: 73
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
## 2011-04-05 ENCOUNTER — Ambulatory Visit: Payer: Medicare Other | Admitting: Cardiovascular Disease

## 2011-05-10 ENCOUNTER — Encounter: Payer: Self-pay | Admitting: Internal Medicine

## 2011-05-10 ENCOUNTER — Ambulatory Visit (INDEPENDENT_AMBULATORY_CARE_PROVIDER_SITE_OTHER): Payer: Medicare Other | Admitting: Internal Medicine

## 2011-05-10 DIAGNOSIS — K219 Gastro-esophageal reflux disease without esophagitis: Secondary | ICD-10-CM

## 2011-05-10 DIAGNOSIS — R112 Nausea with vomiting, unspecified: Secondary | ICD-10-CM

## 2011-05-10 NOTE — Patient Instructions (Signed)
You have been scheduled for an appointment with Dr. Jearld Fenton, [address] [place], [place] [address] [phone] on May 27, 2011 at 2:00pm, arrive at 1:40
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
## 2012-05-02 ENCOUNTER — Inpatient Hospital Stay (HOSPITAL_COMMUNITY): Payer: Medicare Other

## 2012-05-02 ENCOUNTER — Encounter (HOSPITAL_COMMUNITY): Payer: Self-pay | Admitting: Emergency Medicine

## 2012-05-02 ENCOUNTER — Inpatient Hospital Stay (HOSPITAL_COMMUNITY)
Admission: EM | Admit: 2012-05-02 | Discharge: 2012-05-07 | DRG: 871 | Disposition: A | Discharge status: Skilled Nursing Facility | Payer: Medicare Other | Attending: Emergency Medicine | Admitting: Emergency Medicine

## 2012-05-02 ENCOUNTER — Emergency Department (HOSPITAL_COMMUNITY): Payer: Medicare Other

## 2012-05-02 DIAGNOSIS — Z794 Long term (current) use of insulin: Secondary | ICD-10-CM

## 2012-05-02 DIAGNOSIS — E876 Hypokalemia: Secondary | ICD-10-CM | POA: Diagnosis present

## 2012-05-02 DIAGNOSIS — E872 Acidosis, unspecified: Secondary | ICD-10-CM | POA: Diagnosis present

## 2012-05-02 DIAGNOSIS — E119 Type 2 diabetes mellitus without complications: Secondary | ICD-10-CM | POA: Diagnosis present

## 2012-05-02 DIAGNOSIS — J9600 Acute respiratory failure, unspecified whether with hypoxia or hypercapnia: Secondary | ICD-10-CM | POA: Diagnosis present

## 2012-05-02 DIAGNOSIS — Z8614 Personal history of Methicillin resistant Staphylococcus aureus infection: Secondary | ICD-10-CM

## 2012-05-02 DIAGNOSIS — A419 Sepsis, unspecified organism: Principal | ICD-10-CM | POA: Diagnosis present

## 2012-05-02 DIAGNOSIS — I10 Essential (primary) hypertension: Secondary | ICD-10-CM | POA: Diagnosis present

## 2012-05-02 DIAGNOSIS — E875 Hyperkalemia: Secondary | ICD-10-CM | POA: Diagnosis present

## 2012-05-02 DIAGNOSIS — D649 Anemia, unspecified: Secondary | ICD-10-CM | POA: Diagnosis present

## 2012-05-02 DIAGNOSIS — N183 Chronic kidney disease, stage 3 unspecified: Secondary | ICD-10-CM | POA: Diagnosis present

## 2012-05-02 DIAGNOSIS — I2510 Atherosclerotic heart disease of native coronary artery without angina pectoris: Secondary | ICD-10-CM | POA: Diagnosis present

## 2012-05-02 DIAGNOSIS — G9340 Encephalopathy, unspecified: Secondary | ICD-10-CM | POA: Diagnosis present

## 2012-05-02 DIAGNOSIS — N390 Urinary tract infection, site not specified: Secondary | ICD-10-CM | POA: Diagnosis present

## 2012-05-02 DIAGNOSIS — I129 Hypertensive chronic kidney disease with stage 1 through stage 4 chronic kidney disease, or unspecified chronic kidney disease: Secondary | ICD-10-CM | POA: Diagnosis present

## 2012-05-02 DIAGNOSIS — N179 Acute kidney failure, unspecified: Secondary | ICD-10-CM | POA: Diagnosis present

## 2012-05-02 DIAGNOSIS — E871 Hypo-osmolality and hyponatremia: Secondary | ICD-10-CM | POA: Diagnosis present

## 2012-05-02 DIAGNOSIS — G8220 Paraplegia, unspecified: Secondary | ICD-10-CM | POA: Diagnosis present

## 2012-05-02 DIAGNOSIS — N189 Chronic kidney disease, unspecified: Secondary | ICD-10-CM | POA: Diagnosis present

## 2012-05-02 DIAGNOSIS — I252 Old myocardial infarction: Secondary | ICD-10-CM

## 2012-05-02 DIAGNOSIS — Z79899 Other long term (current) drug therapy: Secondary | ICD-10-CM

## 2012-05-02 DIAGNOSIS — Z933 Colostomy status: Secondary | ICD-10-CM

## 2012-05-02 DIAGNOSIS — I498 Other specified cardiac arrhythmias: Secondary | ICD-10-CM | POA: Diagnosis present

## 2012-05-02 LAB — POCT I-STAT TROPONIN I: Troponin i, poc: 0.01 ng/mL (ref 0.00–0.08)

## 2012-05-02 LAB — CBC
MCH: 27.8 pg (ref 26.0–34.0)
MCHC: 30.8 g/dL (ref 30.0–36.0)
MCV: 90.2 fL (ref 78.0–100.0)
Platelets: 375 10*3/uL (ref 150–400)
RBC: 3.17 MIL/uL — ABNORMAL LOW (ref 4.22–5.81)

## 2012-05-02 LAB — POCT I-STAT 3, ART BLOOD GAS (G3+)
Acid-base deficit: 20 mmol/L — ABNORMAL HIGH (ref 0.0–2.0)
Bicarbonate: 11 mEq/L — ABNORMAL LOW (ref 20.0–24.0)
Bicarbonate: 9.1 mEq/L — ABNORMAL LOW (ref 20.0–24.0)
O2 Saturation: 98 %
Patient temperature: 98.6
TCO2: 12 mmol/L (ref 0–100)
TCO2: 10 mmol/L (ref 0–100)
pCO2 arterial: 32.3 mmHg — ABNORMAL LOW (ref 35.0–45.0)
pH, Arterial: 7.009 — CL (ref 7.350–7.450)
pO2, Arterial: 227 mmHg — ABNORMAL HIGH (ref 80.0–100.0)
pO2, Arterial: 140 mmHg — ABNORMAL HIGH (ref 80.0–100.0)

## 2012-05-02 LAB — COMPREHENSIVE METABOLIC PANEL
Albumin: 3 g/dL — ABNORMAL LOW (ref 3.5–5.2)
BUN: 125 mg/dL — ABNORMAL HIGH (ref 6–23)
BUN: 120 mg/dL — ABNORMAL HIGH (ref 6–23)
CO2: 10 mEq/L — CL (ref 19–32)
Calcium: 8 mg/dL — ABNORMAL LOW (ref 8.4–10.5)
Calcium: 7.8 mg/dL — ABNORMAL LOW (ref 8.4–10.5)
Chloride: 96 mEq/L (ref 96–112)
Creatinine, Ser: 7.58 mg/dL — ABNORMAL HIGH (ref 0.50–1.35)
Creatinine, Ser: 7.25 mg/dL — ABNORMAL HIGH (ref 0.50–1.35)
GFR calc Af Amer: 9 mL/min — ABNORMAL LOW (ref >90)
GFR calc non Af Amer: 7 mL/min — ABNORMAL LOW (ref >90)
Glucose, Bld: 235 mg/dL — ABNORMAL HIGH (ref 70–99)
Sodium: 129 mEq/L — ABNORMAL LOW (ref 135–145)
Total Bilirubin: 0.2 mg/dL — ABNORMAL LOW (ref 0.3–1.2)
Total Protein: 6.7 g/dL (ref 6.0–8.3)

## 2012-05-02 LAB — URINALYSIS, ROUTINE W REFLEX MICROSCOPIC
Protein, ur: 100 mg/dL — AB
Urobilinogen, UA: 0.2 mg/dL (ref 0.0–1.0)

## 2012-05-02 LAB — CBC WITH DIFFERENTIAL/PLATELET
Basophils Absolute: 0 10*3/uL (ref 0.0–0.1)
Basophils Relative: 0 % (ref 0–1)
Eosinophils Absolute: 0.1 10*3/uL (ref 0.0–0.7)
Eosinophils Relative: 1 % (ref 0–5)
HCT: 31.8 % — ABNORMAL LOW (ref 39.0–52.0)
Hemoglobin: 9.5 g/dL — ABNORMAL LOW (ref 13.0–17.0)
Lymphocytes Relative: 23 % (ref 12–46)
Lymphs Abs: 2.1 10*3/uL (ref 0.7–4.0)
MCH: 27.8 pg (ref 26.0–34.0)
MCHC: 29.9 g/dL — ABNORMAL LOW (ref 30.0–36.0)
MCV: 93 fL (ref 78.0–100.0)
Monocytes Absolute: 0.4 10*3/uL (ref 0.1–1.0)
Monocytes Relative: 4 % (ref 3–12)
Neutro Abs: 6.5 10*3/uL (ref 1.7–7.7)
Neutrophils Relative %: 71 % (ref 43–77)
Platelets: 358 10*3/uL (ref 150–400)
RBC: 3.42 MIL/uL — ABNORMAL LOW (ref 4.22–5.81)
RDW: 14.6 % (ref 11.5–15.5)
WBC: 9.1 10*3/uL (ref 4.0–10.5)

## 2012-05-02 LAB — MRSA PCR SCREENING: MRSA by PCR: POSITIVE — AB

## 2012-05-02 LAB — BASIC METABOLIC PANEL
CO2: 12 mEq/L — ABNORMAL LOW (ref 19–32)
Calcium: 7.6 mg/dL — ABNORMAL LOW (ref 8.4–10.5)
Creatinine, Ser: 7.2 mg/dL — ABNORMAL HIGH (ref 0.50–1.35)

## 2012-05-02 LAB — PROTIME-INR
INR: 1.16 (ref 0.00–1.49)
Prothrombin Time: 14.6 seconds (ref 11.6–15.2)

## 2012-05-02 LAB — POCT I-STAT, CHEM 8
BUN: >140 mg/dL — ABNORMAL HIGH (ref 6–23)
Calcium, Ion: 1.03 mmol/L — ABNORMAL LOW (ref 1.12–1.23)
Chloride: 110 mEq/L (ref 96–112)
Glucose, Bld: 132 mg/dL — ABNORMAL HIGH (ref 70–99)
HCT: 32 % — ABNORMAL LOW (ref 39.0–52.0)
TCO2: 13 mmol/L (ref 0–100)

## 2012-05-02 LAB — GLUCOSE, CAPILLARY
Glucose-Capillary: 170 mg/dL — ABNORMAL HIGH (ref 70–99)
Glucose-Capillary: 163 mg/dL — ABNORMAL HIGH (ref 70–99)
Glucose-Capillary: 122 mg/dL — ABNORMAL HIGH (ref 70–99)
Glucose-Capillary: 35 mg/dL — CL (ref 70–99)

## 2012-05-02 LAB — PROCALCITONIN: Procalcitonin: 0.14 ng/mL

## 2012-05-02 LAB — RENAL FUNCTION PANEL
Albumin: 2.7 g/dL — ABNORMAL LOW (ref 3.5–5.2)
BUN: 87 mg/dL — ABNORMAL HIGH (ref 6–23)
BUN: 80 mg/dL — ABNORMAL HIGH (ref 6–23)
Calcium: 7.4 mg/dL — ABNORMAL LOW (ref 8.4–10.5)
Creatinine, Ser: 4.91 mg/dL — ABNORMAL HIGH (ref 0.50–1.35)
Glucose, Bld: 50 mg/dL — ABNORMAL LOW (ref 70–99)
Phosphorus: 5.9 mg/dL — ABNORMAL HIGH (ref 2.3–4.6)
Phosphorus: 5.2 mg/dL — ABNORMAL HIGH (ref 2.3–4.6)
Potassium: 4.4 mEq/L (ref 3.5–5.1)
Potassium: 4.2 mEq/L (ref 3.5–5.1)

## 2012-05-02 LAB — PRO B NATRIURETIC PEPTIDE: Pro B Natriuretic peptide (BNP): 2985 pg/mL — ABNORMAL HIGH (ref 0–125)

## 2012-05-02 LAB — TYPE AND SCREEN: ABO/RH(D): O NEG

## 2012-05-02 LAB — OCCULT BLOOD, POC DEVICE: Fecal Occult Bld: NEGATIVE

## 2012-05-02 LAB — MAGNESIUM: Magnesium: 2.2 mg/dL (ref 1.5–2.5)

## 2012-05-02 LAB — SALICYLATE LEVEL: Salicylate Lvl: <2 mg/dL — ABNORMAL LOW (ref 2.8–20.0)

## 2012-05-02 LAB — ACETAMINOPHEN LEVEL: Acetaminophen (Tylenol), Serum: <15 ug/mL (ref 10–30)

## 2012-05-02 LAB — URINE MICROSCOPIC-ADD ON

## 2012-05-02 LAB — FIBRINOGEN: Fibrinogen: 443 mg/dL (ref 204–475)

## 2012-05-02 LAB — TROPONIN I: Troponin I: <0.3 ng/mL (ref <0.30)

## 2012-05-02 LAB — CG4 I-STAT (LACTIC ACID): Lactic Acid, Venous: 1.28 mmol/L (ref 0.5–2.2)

## 2012-05-02 MED ORDER — ALBUTEROL (5 MG/ML) CONTINUOUS INHALATION SOLN
INHALATION_SOLUTION | RESPIRATORY_TRACT | Status: AC
  Filled 2012-05-02: qty 20

## 2012-05-02 MED ORDER — ALBUTEROL SULFATE HFA 108 (90 BASE) MCG/ACT IN AERS
4.0000 | INHALATION_SPRAY | RESPIRATORY_TRACT | Status: DC | PRN
  Filled 2012-05-02: qty 6.7

## 2012-05-02 MED ORDER — LEVOFLOXACIN IN D5W 750 MG/150ML IV SOLN
750.0000 mg | Freq: Once | INTRAVENOUS | Status: DC
  Filled 2012-05-02: qty 150

## 2012-05-02 MED ORDER — PRISMASOL BGK 4/2.5 32-4-2.5 MEQ/L IV SOLN
INTRAVENOUS | Status: DC
  Administered 2012-05-02 – 2012-05-03 (×7): via INTRAVENOUS_CENTRAL
  Filled 2012-05-02 (×10): qty 5000

## 2012-05-02 MED ORDER — LIDOCAINE HCL (CARDIAC) 20 MG/ML IV SOLN
INTRAVENOUS | Status: AC
  Filled 2012-05-02: qty 5

## 2012-05-02 MED ORDER — VANCOMYCIN HCL IN DEXTROSE 1-5 GM/200ML-% IV SOLN
1000.0000 mg | INTRAVENOUS | Status: DC
  Administered 2012-05-02 – 2012-05-03 (×2): 1000 mg via INTRAVENOUS
  Filled 2012-05-02 (×3): qty 200

## 2012-05-02 MED ORDER — SODIUM POLYSTYRENE SULFONATE 15 GM/60ML PO SUSP
30.0000 g | Freq: Once | ORAL | Status: AC
  Administered 2012-05-02: 30 g via ORAL
  Filled 2012-05-02: qty 120

## 2012-05-02 MED ORDER — MUPIROCIN 2 % EX OINT
1.0000 "application " | TOPICAL_OINTMENT | Freq: Two times a day (BID) | CUTANEOUS | Status: AC
  Administered 2012-05-02 – 2012-05-06 (×10): 1 via NASAL
  Filled 2012-05-02: qty 22

## 2012-05-02 MED ORDER — INSULIN ASPART 100 UNIT/ML IV SOLN
10.0000 [IU] | Freq: Once | INTRAVENOUS | Status: AC
  Administered 2012-05-02: 10 [IU] via INTRAVENOUS

## 2012-05-02 MED ORDER — HEPARIN (PORCINE) 2000 UNITS/L FOR CRRT: INTRAVENOUS_CENTRAL | Status: DC | PRN

## 2012-05-02 MED ORDER — HEPARIN SODIUM (PORCINE) 1000 UNIT/ML DIALYSIS
1000.0000 [IU] | INTRAMUSCULAR | Status: DC | PRN
  Administered 2012-05-02 – 2012-05-03 (×2): 3000 [IU] via INTRAVENOUS_CENTRAL
  Filled 2012-05-02: qty 6
  Filled 2012-05-02: qty 3

## 2012-05-02 MED ORDER — FENTANYL CITRATE 0.05 MG/ML IJ SOLN
50.0000 ug | INTRAMUSCULAR | Status: DC | PRN
  Administered 2012-05-02 (×4): 100 ug via INTRAVENOUS
  Administered 2012-05-03: 50 ug via INTRAVENOUS
  Filled 2012-05-02 (×5): qty 2

## 2012-05-02 MED ORDER — DEXTROSE 50 % IV SOLN
50.0000 mL | Freq: Once | INTRAVENOUS | Status: AC
  Administered 2012-05-02: 50 mL via INTRAVENOUS

## 2012-05-02 MED ORDER — DOBUTAMINE IN D5W 4-5 MG/ML-% IV SOLN: 2.5000 ug/kg/min | INTRAVENOUS | Status: DC | PRN

## 2012-05-02 MED ORDER — PANTOPRAZOLE SODIUM 40 MG IV SOLR
40.0000 mg | Freq: Every day | INTRAVENOUS | Status: DC
  Administered 2012-05-02 – 2012-05-05 (×4): 40 mg via INTRAVENOUS
  Filled 2012-05-02 (×5): qty 40

## 2012-05-02 MED ORDER — CHLORHEXIDINE GLUCONATE 0.12 % MT SOLN
15.0000 mL | Freq: Two times a day (BID) | OROMUCOSAL | Status: DC
  Administered 2012-05-02 – 2012-05-04 (×5): 15 mL via OROMUCOSAL
  Filled 2012-05-02 (×8): qty 15

## 2012-05-02 MED ORDER — SUCCINYLCHOLINE CHLORIDE 20 MG/ML IJ SOLN: 120.0000 mg | Freq: Once | INTRAMUSCULAR | Status: DC

## 2012-05-02 MED ORDER — DOPAMINE-DEXTROSE 3.2-5 MG/ML-% IV SOLN
INTRAVENOUS | Status: AC
  Administered 2012-05-02: 10 ug/kg/min via INTRAVENOUS
  Filled 2012-05-02: qty 250

## 2012-05-02 MED ORDER — ATROPINE SULFATE 0.1 MG/ML IJ SOLN
1.0000 mg | Freq: Once | INTRAMUSCULAR | Status: AC
  Administered 2012-05-02: 0.5 mg via INTRAVENOUS

## 2012-05-02 MED ORDER — SODIUM CHLORIDE 0.9 % IV BOLUS (SEPSIS)
25.0000 mL/kg | Freq: Once | INTRAVENOUS | Status: AC
  Administered 2012-05-02: 2460 mL via INTRAVENOUS

## 2012-05-02 MED ORDER — VASOPRESSIN 20 UNIT/ML IJ SOLN
0.0300 [IU]/min | INTRAVENOUS | Status: DC
  Administered 2012-05-02: 0.03 [IU]/min via INTRAVENOUS
  Filled 2012-05-02: qty 2.5

## 2012-05-02 MED ORDER — SODIUM BICARBONATE 8.4 % IV SOLN
50.0000 meq | Freq: Once | INTRAVENOUS | Status: AC
  Administered 2012-05-02: 50 meq via INTRAVENOUS

## 2012-05-02 MED ORDER — CHLORHEXIDINE GLUCONATE CLOTH 2 % EX PADS
6.0000 | MEDICATED_PAD | Freq: Every day | CUTANEOUS | Status: AC
  Administered 2012-05-02 – 2012-05-06 (×5): 6 via TOPICAL

## 2012-05-02 MED ORDER — FENTANYL CITRATE 0.05 MG/ML IJ SOLN
INTRAMUSCULAR | Status: AC
  Filled 2012-05-02: qty 2

## 2012-05-02 MED ORDER — MIDAZOLAM BOLUS VIA INFUSION
1.0000 mg | INTRAVENOUS | Status: DC | PRN
  Administered 2012-05-03: 2 mg via INTRAVENOUS
  Filled 2012-05-02: qty 2

## 2012-05-02 MED ORDER — HEPARIN SODIUM (PORCINE) 5000 UNIT/ML IJ SOLN
5000.0000 [IU] | Freq: Three times a day (TID) | INTRAMUSCULAR | Status: DC
  Administered 2012-05-02 – 2012-05-07 (×16): 5000 [IU] via SUBCUTANEOUS
  Filled 2012-05-02 (×19): qty 1

## 2012-05-02 MED ORDER — SODIUM CHLORIDE 0.9 % IV SOLN: 250.0000 mL | INTRAVENOUS | Status: DC | PRN

## 2012-05-02 MED ORDER — DEXTROSE 50 % IV SOLN
INTRAVENOUS | Status: AC
  Administered 2012-05-02: 50 mL
  Filled 2012-05-02: qty 50

## 2012-05-02 MED ORDER — ALBUTEROL SULFATE (5 MG/ML) 0.5% IN NEBU
10.0000 mg | INHALATION_SOLUTION | Freq: Once | RESPIRATORY_TRACT | Status: AC
  Administered 2012-05-02: 10 mg via RESPIRATORY_TRACT

## 2012-05-02 MED ORDER — SODIUM BICARBONATE 8.4 % IV SOLN
INTRAVENOUS | Status: AC
  Administered 2012-05-02: 50 meq via INTRAVENOUS
  Filled 2012-05-02: qty 50

## 2012-05-02 MED ORDER — ETOMIDATE 2 MG/ML IV SOLN
INTRAVENOUS | Status: AC
  Filled 2012-05-02: qty 20

## 2012-05-02 MED ORDER — SODIUM CHLORIDE 0.9 % IV SOLN
500.0000 mg | Freq: Four times a day (QID) | INTRAVENOUS | Status: DC
  Administered 2012-05-02 – 2012-05-03 (×4): 500 mg via INTRAVENOUS
  Filled 2012-05-02 (×6): qty 500

## 2012-05-02 MED ORDER — SODIUM CHLORIDE 0.9 % IV SOLN
INTRAVENOUS | Status: DC
  Administered 2012-05-02: 05:00:00 via INTRAVENOUS

## 2012-05-02 MED ORDER — VANCOMYCIN HCL IN DEXTROSE 1-5 GM/200ML-% IV SOLN
1000.0000 mg | Freq: Once | INTRAVENOUS | Status: AC
  Administered 2012-05-02: 1000 mg via INTRAVENOUS
  Filled 2012-05-02: qty 200

## 2012-05-02 MED ORDER — FENTANYL CITRATE 0.05 MG/ML IJ SOLN
50.0000 ug | Freq: Once | INTRAMUSCULAR | Status: AC
  Administered 2012-05-02: 50 ug via INTRAVENOUS
  Filled 2012-05-02: qty 2

## 2012-05-02 MED ORDER — SODIUM BICARBONATE 4.2 % IV SOLN: 50.0000 meq | Freq: Once | INTRAVENOUS | Status: DC

## 2012-05-02 MED ORDER — BIOTENE DRY MOUTH MT LIQD
15.0000 mL | Freq: Four times a day (QID) | OROMUCOSAL | Status: DC
  Administered 2012-05-02 – 2012-05-04 (×7): 15 mL via OROMUCOSAL

## 2012-05-02 MED ORDER — ROCURONIUM BROMIDE 50 MG/5ML IV SOLN
INTRAVENOUS | Status: AC
  Filled 2012-05-02: qty 2

## 2012-05-02 MED ORDER — SUCCINYLCHOLINE CHLORIDE 20 MG/ML IJ SOLN
INTRAMUSCULAR | Status: AC
  Filled 2012-05-02: qty 1

## 2012-05-02 MED ORDER — STERILE WATER FOR INJECTION IV SOLN
INTRAVENOUS | Status: DC
  Administered 2012-05-02 – 2012-05-03 (×10): via INTRAVENOUS_CENTRAL
  Filled 2012-05-02 (×13): qty 150

## 2012-05-02 MED ORDER — NOREPINEPHRINE BITARTRATE 1 MG/ML IJ SOLN
2.0000 ug/min | INTRAVENOUS | Status: DC | PRN
  Administered 2012-05-02: 10 ug/min via INTRAVENOUS
  Filled 2012-05-02 (×2): qty 8

## 2012-05-02 MED ORDER — PRISMASOL BGK 0/2.5 32-2.5 MEQ/L IV SOLN
INTRAVENOUS | Status: DC
  Administered 2012-05-02: 09:00:00 via INTRAVENOUS_CENTRAL
  Filled 2012-05-02 (×2): qty 5000

## 2012-05-02 MED ORDER — INSULIN ASPART 100 UNIT/ML ~~LOC~~ SOLN
5.0000 [IU] | Freq: Once | SUBCUTANEOUS | Status: DC
  Filled 2012-05-02: qty 1

## 2012-05-02 MED ORDER — INSULIN ASPART 100 UNIT/ML ~~LOC~~ SOLN
2.0000 [IU] | SUBCUTANEOUS | Status: DC
  Administered 2012-05-02 (×2): 2 [IU] via SUBCUTANEOUS

## 2012-05-02 MED ORDER — SODIUM CHLORIDE 0.9 % IV BOLUS (SEPSIS): 500.0000 mL | INTRAVENOUS | Status: DC | PRN

## 2012-05-02 MED ORDER — SODIUM CHLORIDE 0.9 % IV SOLN
500.0000 mg | Freq: Once | INTRAVENOUS | Status: AC
  Administered 2012-05-02: 500 mg via INTRAVENOUS
  Filled 2012-05-02: qty 500

## 2012-05-02 MED ORDER — MIDAZOLAM HCL 5 MG/ML IJ SOLN
1.0000 mg/h | INTRAMUSCULAR | Status: DC
  Administered 2012-05-02: 2 mg/h via INTRAVENOUS
  Administered 2012-05-02 (×2): 4 mg/h via INTRAVENOUS
  Filled 2012-05-02 (×2): qty 10

## 2012-05-02 MED ORDER — SODIUM CHLORIDE 0.9 % IV SOLN
1.0000 g | Freq: Once | INTRAVENOUS | Status: AC
  Administered 2012-05-02: 1 g via INTRAVENOUS
  Filled 2012-05-02: qty 10

## 2012-05-02 MED ORDER — HEPARIN SODIUM (PORCINE) 1000 UNIT/ML IJ SOLN
INTRAMUSCULAR | Status: AC
  Filled 2012-05-02: qty 3

## 2012-05-02 MED ORDER — SODIUM CHLORIDE 0.9 % IV BOLUS (SEPSIS)
500.0000 mL | Freq: Once | INTRAVENOUS | Status: AC
  Administered 2012-05-02: 500 mL via INTRAVENOUS

## 2012-05-02 MED ORDER — SODIUM CHLORIDE 0.9 % IV SOLN
250.0000 mg | Freq: Two times a day (BID) | INTRAVENOUS | Status: DC
  Filled 2012-05-02: qty 250

## 2012-05-02 MED ORDER — SODIUM CHLORIDE 0.9 % IV BOLUS (SEPSIS): 500.0000 mL | Freq: Once | INTRAVENOUS | Status: DC

## 2012-05-02 MED FILL — Medication: Qty: 1 | Status: AC

## 2012-05-02 NOTE — ED Notes (Signed)
mAmps increased to 80.

## 2012-05-02 NOTE — Procedures (Signed)
Hemodialysis Catheter Insertion Procedure Note [name] [id number] 03-Apr-1956  Procedure: Insertion of Hemodialysis Catheter Indications: Dialysis Access   Procedure Details Consent: Risks of procedure as well as the alternatives and risks of each were explained to the (patient/caregiver).  Consent for procedure obtained. Time Out: Verified patient identification, verified procedure, site/side was marked, verified correct patient position, special equipment/implants available, medications/allergies/relevent history reviewed, required imaging and test results available.  Performed  Maximum sterile technique was used including antiseptics, cap, gloves, gown, hand hygiene, mask and sheet. Skin prep: Chlorhexidine; local anesthetic administered Triple lumen hemodialysis catheter was inserted into left internal jugular vein using the Seldinger technique.  Evaluation Blood flow good Complications: No apparent complications Patient did tolerate procedure well. Chest X-ray ordered to verify placement.  CXR: pending.   [name] K. 05/02/2012

## 2012-05-02 NOTE — ED Notes (Signed)
Patient to be intubated at this time by Dr Fonnie Jarvis.  Patient received 2.5mg  of Versed by EMS enroute to ED for pacing.

## 2012-05-02 NOTE — ED Notes (Signed)
Patient from [facility] with [facility].  Patient HR in 20's with EMS.  Patient is pale, cool and clammy.  Patient was being paced by EMS after Atropine.  Atropine with no effect.

## 2012-05-02 NOTE — ED Notes (Signed)
Carotid pulse felt at this time at 80.

## 2012-05-02 NOTE — Procedures (Signed)
Arterial Catheter Insertion Procedure Note Tavonte Seybold [id number] June 04, 1955  Procedure: Insertion of Arterial Catheter  Indications: Blood pressure monitoring and Frequent blood sampling  Procedure Details Consent: Risks of procedure as well as the alternatives and risks of each were explained to the (patient/caregiver).  Consent for procedure obtained. Time Out: Verified patient identification, verified procedure, site/side was marked, verified correct patient position, special equipment/implants available, medications/allergies/relevent history reviewed, required imaging and test results available.  Performed  Maximum sterile technique was used including antiseptics, gloves, gown, hand hygiene and mask. Skin prep: Chlorhexidine; local anesthetic administered 20 gauge catheter was inserted into right radial artery using the Seldinger technique.  Evaluation Blood flow good; BP tracing good. Complications: No apparent complications.   Clearnce Sorrel [facility] 05/02/2012

## 2012-05-02 NOTE — Procedures (Signed)
Arterial Catheter Insertion Procedure Note [name] [id number] Jan 09, 1956  Procedure: Insertion of Arterial Catheter  Indications: Blood pressure monitoring and Frequent blood sampling  Procedure Details Consent: Risks of procedure as well as the alternatives and risks of each were explained to the (patient/caregiver).  Consent for procedure obtained. Time Out: Verified patient identification, verified procedure, site/side was marked, verified correct patient position, special equipment/implants available, medications/allergies/relevent history reviewed, required imaging and test results available.  Performed  Maximum sterile technique was used including antiseptics, cap, gloves, hand hygiene and mask. Skin prep: Chlorhexidine; local anesthetic administered 20 gauge catheter was inserted into left radial artery using the Seldinger technique.  Evaluation Blood flow poor; BP tracing good. Complications: No apparent complications.   [name] 05/02/2012
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
## 2012-05-02 NOTE — Progress Notes (Signed)
CRITICAL VALUE ALERT  Critical value received:  Positive MRSA PCR-Nasal  Date of notification:  05/02/2012   Time of notification:  0900  Critical value read back:yes  Nurse who received alert:  Verlon Au, RN  MD notified (1st page):  Dr. Marin Shutter  Time of first page:  0900   MD notified (2nd page):  Time of second page:  Responding MD:  Dr. Marin Shutter  Time MD responded:  0900

## 2012-05-02 NOTE — ED Notes (Signed)
Patient placed on pacer in ED, capturing at 80, 28mA.
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
## 2012-05-02 NOTE — Progress Notes (Signed)
Bladder scan completed and showed only 15 cc of urine. Urinary catheter flushed and culture sent to lab.

## 2012-05-02 NOTE — Progress Notes (Signed)
eLink Physician-Brief Progress Note Patient Name: [name] DOB: 1956-04-11 MRN: [id number]  Date of Service  05/02/2012   HPI/Events of Note  Patient admitted following episode of bradycardia associated with hyperkalemia (8.2 mEq/L).  Concern for sepsis syndrome as well but lactate is WNL with normal WBC.  Placed on sepsis protocol.  Will need to assess volume status and monitor fluid administration closely.  Nurses also report thick urine in suprapubic catheter with no flow.     eICU Interventions  Plan: Bladder scan to evaluate urinary volume Renal US to evaluate renal size/echogenicity and r/o obstruction CVP to assess volume status Renal Consult for anticipated RRT given acidosis and electrolyte issues.   Intervention Category Intermediate Interventions: Oliguria - evaluation and management Minor Interventions: Clinical assessment - ordering diagnostic tests  Mehreen Azizi 05/02/2012, 5:09 AM

## 2012-05-02 NOTE — ED Notes (Signed)
Pacer at 80, mA up to 40.

## 2012-05-02 NOTE — ED Notes (Signed)
Patient with rhythm change on monitor to sinus, wide complex, BP increasing.  Patient is awake and understands questions and answering appropriately with head nodding.

## 2012-05-02 NOTE — Progress Notes (Signed)
CRITICAL VALUE ALERT  Critical value received:  K+-6.7  Date of notification:  05/02/2012   Time of notification:  09:50  Critical value read back:yes  Nurse who received alert:  Verlon Au, RN   MD notified (1st page):  Dr. Marin Shutter  Time of first page:  09:50  MD notified (2nd page):  Time of second page:  Responding MD:  Dr. Marin Shutter   Time MD responded:  09:50
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
## 2012-05-02 NOTE — Progress Notes (Signed)
UR completed 

## 2012-05-02 NOTE — ED Notes (Signed)
Dr Alinda Deem at bedside for central line placement.
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
## 2012-05-02 NOTE — Progress Notes (Signed)
CRITICAL VALUE ALERT  Critical value received:  CBG-35   Date of notification:  05/02/2012  Time of notification:  1520  Critical value read back:yes  Nurse who received alert:  Verlon Au, RN   MD notified (1st page):  Dr. Marin Shutter  Time of first page:  1530  MD notified (2nd page):  Time of second page:  Responding MD:  Dr. Marin Shutter  Time MD responded:  [phone]

## 2012-05-02 NOTE — Progress Notes (Signed)
ANTIBIOTIC CONSULT NOTE - INITIAL  Pharmacy Consult for Vancomycin/Primaxin Indication: Sepsis  Allergies  Allergen Reactions  . Iohexol      Desc: PT ALLERGIC TO CONTRAST, NEEDS FULL 13 HR PREMEDS. I DO NOT HAVE ANY OTHER INFO AT THIS TIME.  PT RESIDES AT [facility] AND WE WERE MADE AWARE OF HIS ALLERGY TODAY., Onset Date: 16109604   . Metformin   . Noroxin (Norfloxacin)   . Piperacillin Sod-Tazobactam So     Patient Measurements: Height: 5\' 4"  (162.6 cm) IBW/kg (Calculated) : 59.2  Total Body Weight (06/2010): 86 kg  Vital Signs: Temp: 95 F (35 C) (12/14 0309) Temp src: Oral (12/14 0309) BP: 90/50 mmHg (12/14 0415) Pulse Rate: 74  (12/14 0415) Intake/Output from previous day: 12/13 0701 - 12/14 0700 In: 2000 [I.V.:2000] Out: -  Intake/Output from this shift: Total I/O In: 2000 [I.V.:2000] Out: -   Labs:  Basename 05/02/12 0334 05/02/12 0209 05/02/12 0204 05/02/12 0146  WBC -- -- -- 9.1  HGB -- 10.9* 11.2* 9.5*  PLT -- -- -- 358  LABCREA -- -- -- --  CREATININE 7.25* 7.50* 7.00* --   The CrCl is unknown because both a height and weight (above a minimum accepted value) are required for this calculation. No results found for this basename: VANCOTROUGH:2,VANCOPEAK:2,VANCORANDOM:2,GENTTROUGH:2,GENTPEAK:2,GENTRANDOM:2,TOBRATROUGH:2,TOBRAPEAK:2,TOBRARND:2,AMIKACINPEAK:2,AMIKACINTROU:2,AMIKACIN:2, in the last 72 hours   Microbiology: No results found for this or any previous visit (from the past 720 hour(s)).  Medical History: Past Medical History  Diagnosis Date  . CAD (coronary artery disease)   . Paraplegia   . History of MRSA infection   . Decubitus ulcer     multiple  . Anxiety   . Depression   . Gout   . GERD (gastroesophageal reflux disease)   . MI (myocardial infarction)     2010  . H/O: GI bleed   . Schatzki's ring   . Chronic kidney disease   . Unspecified essential hypertension   . Diabetes mellitus   . Hyperlipemia     Medications:   Norvasc  ASA  Lipitor  Coreg  Vit D  Plavix  Docusate Tricor  Novolog  Lantus  Zestril  Melatonin  MSContin  MVI  Protonix  Florastor  NaBicarbonate Invanz 1 g IM daily  Assessment: 56 yo male with VDRF, ARF, sepsis for empiric antibiotics.  Vancomycin 1 g IV given in ED at 0300  Goal of Therapy:  Vancomycin trough 15-20  Plan:  Vancomycin 500 mg IV now ( for total of 1500 mg today).  Primaxin 250 mg IV q12h F/U renal function  Eddie Candle 05/02/2012,4:44 AM
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
## 2012-05-03 DIAGNOSIS — G8220 Paraplegia, unspecified: Secondary | ICD-10-CM

## 2012-05-03 DIAGNOSIS — E872 Acidosis: Secondary | ICD-10-CM | POA: Diagnosis present

## 2012-05-03 DIAGNOSIS — E875 Hyperkalemia: Secondary | ICD-10-CM | POA: Diagnosis present

## 2012-05-03 DIAGNOSIS — G9340 Encephalopathy, unspecified: Secondary | ICD-10-CM | POA: Diagnosis present

## 2012-05-03 DIAGNOSIS — J9600 Acute respiratory failure, unspecified whether with hypoxia or hypercapnia: Secondary | ICD-10-CM | POA: Diagnosis present

## 2012-05-03 DIAGNOSIS — N179 Acute kidney failure, unspecified: Secondary | ICD-10-CM | POA: Insufficient documentation

## 2012-05-03 LAB — GLUCOSE, CAPILLARY
Glucose-Capillary: 161 mg/dL — ABNORMAL HIGH (ref 70–99)
Glucose-Capillary: 63 mg/dL — ABNORMAL LOW (ref 70–99)
Glucose-Capillary: 75 mg/dL (ref 70–99)
Glucose-Capillary: 80 mg/dL (ref 70–99)
Glucose-Capillary: 84 mg/dL (ref 70–99)

## 2012-05-03 LAB — POCT I-STAT 3, ART BLOOD GAS (G3+)
Bicarbonate: 36.7 mEq/L — ABNORMAL HIGH (ref 20.0–24.0)
O2 Saturation: 98 %
pCO2 arterial: 52.5 mmHg — ABNORMAL HIGH (ref 35.0–45.0)
pO2, Arterial: 106 mmHg — ABNORMAL HIGH (ref 80.0–100.0)

## 2012-05-03 LAB — RENAL FUNCTION PANEL
BUN: 43 mg/dL — ABNORMAL HIGH (ref 6–23)
Calcium: 7.7 mg/dL — ABNORMAL LOW (ref 8.4–10.5)
Glucose, Bld: 104 mg/dL — ABNORMAL HIGH (ref 70–99)
Phosphorus: 3.5 mg/dL (ref 2.3–4.6)

## 2012-05-03 LAB — URINE CULTURE: Colony Count: >=100000

## 2012-05-03 LAB — MAGNESIUM: Magnesium: 1.9 mg/dL (ref 1.5–2.5)

## 2012-05-03 MED ORDER — DEXTROSE 50 % IV SOLN
INTRAVENOUS | Status: AC
  Administered 2012-05-03: 50 mL
  Filled 2012-05-03: qty 50

## 2012-05-03 MED ORDER — DEXTROSE 50 % IV SOLN: 50.0000 mL | Freq: Once | INTRAVENOUS | Status: AC | PRN

## 2012-05-03 MED ORDER — SODIUM CHLORIDE 0.9 % IV SOLN
250.0000 mg | Freq: Four times a day (QID) | INTRAVENOUS | Status: DC
  Administered 2012-05-03 – 2012-05-07 (×16): 250 mg via INTRAVENOUS
  Filled 2012-05-03 (×20): qty 250

## 2012-05-03 NOTE — ED Provider Notes (Signed)
I saw and evaluated the patient, reviewed the resident's note and I agree with the findings and plan and was immediately available during the procedure.  Hurman Horn, MD 05/03/12 1300

## 2012-05-03 NOTE — Progress Notes (Signed)
Hypoglycemic Event  CBG: 63  Treatment: D50 IV 50 mL  Symptoms: None  Follow-up CBG: Time:0410 CBG Result:161  Possible Reasons for Event: Inadequate meal intake  Comments/MD notified:Dr. Les Pou, Estrella Deeds  Remember to initiate Hypoglycemia Order Set & complete

## 2012-05-03 NOTE — Progress Notes (Signed)
Patient ID: [name], male   DOB: Sep 29, 1955, 56 y.o.   MRN: [id number]   [facility] Progress Note    Subjective:   Tolerated CRRT overnight with system clotting and restarted this morning. Has been wean off pressors. Remains intubated/sedated   Objective:   BP 129/68  Pulse 64  Temp 97.9 F (36.6 C) (Oral)  Resp 30  Ht 5\' 11"  (1.803 m)  Wt 96.1 kg (211 lb 13.8 oz)  BMI 29.55 kg/m2  SpO2 98%  Intake/Output Summary (Last 24 hours) at 05/03/12 0741 Last data filed at 05/03/12 0700  Gross per 24 hour  Intake 1931.17 ml  Output   3900 ml  Net -1968.83 ml   Weight change: -2.3 kg (-5 lb 1.1 oz)  Physical Exam: ZHY:[id number], sedated, opens eyes to examination/calling his name EXB:MWUXL RRR, normal S1 and S2  Resp:Coarse/mechanical BS bilaterally KGM:WNUU, obese, suprapubic catheter and colostomy bags in-situ VOZ:DGUYQIH LE dermopathy with erythema right leg   Imaging: US Renal Port  05/02/2012  *RADIOLOGY REPORT*  Clinical Data: Acute on chronic renal failure  RENAL/URINARY TRACT ULTRASOUND COMPLETE  Comparison:  CT abdomen pelvis dated 08/29/2008  Findings:  Right Kidney:  Measures 11.0 cm.  Poorly visualized.  No mass or hydronephrosis.  Left Kidney:  Measures 13.9 cm.  No mass or hydronephrosis.  Bladder:  Decompressed by indwelling Foley catheter.  Additional comments: Coarse, echogenic hepatic parenchyma, nonspecific.  Possible perihepatic ascites.  IMPRESSION: No hydronephrosis.  Bladder decompressed by indwelling Foley catheter.   Original Report Authenticated By: Charline Bills, M.D.    Dg Chest Port 1 View  05/02/2012  *RADIOLOGY REPORT*  Clinical Data: Left IJ placement  PORTABLE CHEST - 1 VIEW  Comparison: 05/02/2012  Findings: Endotracheal tube terminates 2.5 cm above the carina.  Interval placement of a left IJ dual lumen dialysis catheter with its tip in the mid SVC.  No pneumothorax.  Stable right IJ venous catheter.  Enteric tube courses  below the diaphragm.  Defibrillator pad overlying the left hemithorax.  Low lung volumes with vascular crowding and bilateral lower lobe atelectasis.  Possible small right pleural effusion.  Stable cardiomegaly.  IMPRESSION: Endotracheal tube terminates 2.5 cm above the carina.  Interval placement of a left IJ dual lumen dialysis catheter with its tip in the mid SVC.  No pneumothorax.  Otherwise unchanged.   Original Report Authenticated By: Charline Bills, M.D.    Dg Chest Port 1 View  05/02/2012  *RADIOLOGY REPORT*  Clinical Data: Central line placement.  PORTABLE CHEST - 1 VIEW  Comparison: 05/02/2012 at 0143 hours  Findings: Endotracheal and NG tube remain unchanged in position. Interval placement of a right internal jugular venous catheter with tip in the upper SVC region.  No pneumothorax.  Cardiac enlargement with some pulmonary vascular congestion.  Shallow inspiration. Probable atelectasis and effusion in the right base.  IMPRESSION: Right central venous catheter placed with tip over the upper SVC region.  No pneumothorax.  Examination is otherwise unchanged.   Original Report Authenticated By: Burman Nieves, M.D.    Dg Chest Portable 1 View  05/02/2012  *RADIOLOGY REPORT*  Clinical Data: Post intubation  PORTABLE CHEST - 1 VIEW  Comparison: Portable exam  Findings: Tip of endotracheal tube 3.6 cm above carina. Nasogastric tube extends into abdomen. External pacing leads present. Elevation of right diaphragm with right basilar atelectasis and decreased right lung volume. Left lung grossly clear. No pleural effusion or pneumothorax. Bones demineralized. Prior cervical spine fusion.  IMPRESSION: Elevation of  right diaphragm and right basilar atelectasis. Satisfactory endotracheal tube position.   Original Report Authenticated By: Ulyses Southward, M.D.    Dg Abd Portable 1v  05/02/2012  *RADIOLOGY REPORT*  Clinical Data: Lower abdominal pain  PORTABLE ABDOMEN - 1 VIEW  Comparison: None.  Findings:  No dilated loops of large or small bowel.  There is a moderate size stool ball in the rectum measuring 6.7 x 8.5 cm.  No pathologic calcifications.   There is chronic erosion and dislocation of the hips. Tip of NG tube projects over the GE junction region.  IMPRESSION:  1.  Stool ball the rectum. 2.  No evidence of bowel obstruction. 3.  Chronic dislocation.   Original Report Authenticated By: Genevive Bi, M.D.     Labs: BMET  Lab 05/03/12 0425 05/02/12 1650 05/02/12 1422 05/02/12 0900 05/02/12 0334 05/02/12 0209 05/02/12 0204 05/02/12 0146  NA 137 137 137 130* 129* 128* 129* --  K 3.4* 4.2 4.4 6.7* 7.0* 8.2* 8.3* --  CL 92* 97 99 96 96 110 111 --  CO2 31 22 20  12* 8* -- -- 10*  GLUCOSE 104* 82 50* 175* 235* 132* 132* --  BUN 43* 80* 87* 115* 120* >140* >140* --  CREATININE 2.86* 4.91* 5.35* 7.20* 7.25* 7.50* 7.00* --  ALB -- -- -- -- -- -- -- --  CALCIUM 7.7* 7.4* 7.4* 7.6* 7.8* -- -- 8.0*  PHOS 3.5 5.2* 5.9* 10.4* -- -- -- --   CBC  Lab 05/02/12 0900 05/02/12 0209 05/02/12 0204 05/02/12 0146  WBC 10.1 -- -- 9.1  NEUTROABS -- -- -- 6.5  HGB 8.8* 10.9* 11.2* 9.5*  HCT 28.6* 32.0* 33.0* 31.8*  MCV 90.2 -- -- 93.0  PLT 375 -- -- 358    Medications:      . antiseptic oral rinse  15 mL Mouth Rinse QID  . chlorhexidine  15 mL Mouth Rinse BID  . Chlorhexidine Gluconate Cloth  6 each Topical Q0600  . heparin  5,000 Units Subcutaneous Q8H  . imipenem-cilastatin  500 mg Intravenous Q6H  . insulin aspart  2-6 Units Subcutaneous Q4H  . mupirocin ointment  1 application Nasal BID  . pantoprazole (PROTONIX) IV  40 mg Intravenous QHS  . sodium chloride  500 mL Intravenous Once  . vancomycin  1,000 mg Intravenous Q24H     Assessment/ Plan:   1. Acute renal failure on CKD3: Baseline chronic kidney disease stage III he is likely from underlying diabetes and hypertension. His acute renal failure seems to be associated with what appears to be sepsis from urinary tract infection versus  bradycardia associated hypotension. UOP has significantly improved and I suspect that renal recovery may be underway (liley delayed or slow with baseline CKD). Given correction of acute problems- I will stop CRRT today and follow labs/UOP- With improved BPs, he may tolerate IHD.  2. Hyperkalemia: Likely secondary to acute renal failure in the face of ongoing ACE inhibitor therapy. Corrected with kayexalate and CRRT. Will stop CRRT as he is in fact now hypokalemic.  3. Metabolic acidosis: Corrected with CRRT and now alkalotic from overshooting on CRRT (Pre-filter HCO3)- DC CRRT/HCO3 4. Urinary tract infection with sepsis: Has been started on Primaxin/Vancomycin. Off pressors.   Zetta Bills, MD 05/03/2012, 7:41 AM
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
## 2012-05-03 NOTE — Procedures (Signed)
Extubation Procedure Note  Patient Details:   Name: [name] DOB: 1956/02/15 MRN: [id number]  Pt extubated to 4L [place] per MD order. VS stable, pt able to vocalize, no stridor noted, RT will continue to monitor.    Evaluation  O2 sats: stable throughout Complications: No apparent complications Patient did tolerate procedure well. Bilateral Breath Sounds: Diminished Suctioning: Airway Yes  Harley Hallmark 05/03/2012, 3:49 PM

## 2012-05-03 NOTE — H&P (Signed)
PULMONARY  / CRITICAL CARE MEDICINE  Name: [name] MRN: [id number] DOB: 04/02/1956    LOS: 1  REFERRING MD:  EDP  CHIEF COMPLAINT:  Acute encephalopathy  BRIEF PATIENT DESCRIPTION:  56 yo [facility] resident with paraplegia found to be lethargic and bradycardic. Intubated for airway protection. Lab work revealed K of 8.3, creatinine of 7.5 and severe metabolic acidosis.  LINES / TUBES: Suprapubic cath - preadmission OETT 12/14 >>> OGT  12/14 >>> HD cath 12/14 >>> R IJ CVL 12/14 >>>  CULTURES: 11/26  Urine >>> ESBL Klebsiella 12/14  Blood >>> 12/15  Respiratory >>> 12/14  Urine >>>  ANTIBIOTICS: Imipenem 12/14 >>> Vancomycin 12/14 >>>  SIGNIFICANT EVENTS:  12/14  Found to be lethargic and bradycardic. Intubated for airway protection. Lab work revealed K of 8.3, creatinine of 7.5 and severe metabolic acidosis. CVVHD.  LEVEL OF CARE:  ICU  PRIMARY SERVICE:  PCCM  CONSULTANTS:  Renal  CODE STATUS:  Full  DIET:  NPO  DVT Px:  Heparin  GI Px:  Protonix  INTERVAL HISTORY:  No overnight issues.  Off pressors.  CVVH stopped.  VITAL SIGNS: Temp:  [97.3 F (36.3 C)-98.3 F (36.8 C)] 97.9 F (36.6 C) (12/15 0734) Pulse Rate:  [43-92] 58  (12/15 0820) Resp:  [19-31] 30  (12/15 0820) BP: (84-165)/(50-118) 119/58 mmHg (12/15 0820) SpO2:  [91 %-100 %] 97 % (12/15 0820) FiO2 (%):  [40 %] 40 % (12/15 0820) Weight:  [96.1 kg (211 lb 13.8 oz)] 96.1 kg (211 lb 13.8 oz) (12/15 0500)  HEMODYNAMICS: CVP:  [8 mmHg-16 mmHg] 12 mmHg  VENTILATOR SETTINGS: Vent Mode:  [-] PRVC FiO2 (%):  [40 %] 40 % Set Rate:  [30 bmp] 30 bmp Vt Set:  [470 mL] 470 mL PEEP:  [5 cmH20] 5 cmH20 Plateau Pressure:  [14 cmH20-26 cmH20] 14 cmH20  INTAKE / OUTPUT: Intake/Output      12/14 0701 - 12/15 0700 12/15 0701 - 12/16 0700   I.V. (mL/kg) 1231.2 (12.8)    NG/GT 120    IV Piggyback 610    Total Intake(mL/kg) 1961.2 (20.4)    Urine (mL/kg/hr) 2195 (1)    Emesis/NG output 1380    Other 100     Stool 300    Total Output 3975    Net -2013.8          PHYSICAL EXAMINATION: General:  Appears acutely ill, mechanically ventilated, synchronous Neuro:  Encephalopathic, nonfocal, cough / gag diminishes HEENT:  PERRL, OETT / OGT Cardiovascular:  RRR, no m/r/g Lungs:  Bilateral diminished air entry, no w/r/r Abdomen:  Soft, nontender, bowel sounds diminished, colostomy intact, suprapubic catheter Musculoskeletal:  Moves all extremities Skin:  Healing sacral decub  LABS:  Lab 05/03/12 0425 05/02/12 1650 05/02/12 1422 05/02/12 0900 05/02/12 0626 05/02/12 0415 05/02/12 0414 05/02/12 0334 05/02/12 0222 05/02/12 0218 05/02/12 0209 05/02/12 0205 05/02/12 0204 05/02/12 0147 05/02/12 0146  HGB -- -- -- 8.8* -- -- -- -- -- -- 10.9* -- 11.2* -- --  WBC -- -- -- 10.1 -- -- -- -- -- -- -- -- -- -- 9.1  PLT -- -- -- 375 -- -- -- -- -- -- -- -- -- -- 358  NA 137 137 137 -- -- -- -- -- -- -- -- -- -- -- --  K 3.4* 4.2 -- -- -- -- -- -- -- -- -- -- -- -- --  CL 92* 97 99 -- -- -- -- -- -- -- -- -- -- -- --  CO2 31 22 20  -- -- -- -- -- -- -- -- -- -- -- --  GLUCOSE 104* 82 50* -- -- -- -- -- -- -- -- -- -- -- --  BUN 43* 80* 87* -- -- -- -- -- -- -- -- -- -- -- --  CREATININE 2.86* 4.91* 5.35* -- -- -- -- -- -- -- -- -- -- -- --  CALCIUM 7.7* 7.4* 7.4* -- -- -- -- -- -- -- -- -- -- -- --  MG 1.9 -- -- 2.2 -- -- -- -- -- -- -- -- -- -- --  PHOS 3.5 5.2* 5.9* -- -- -- -- -- -- -- -- -- -- -- --  AST -- -- -- -- -- -- -- 22 -- -- -- -- -- -- 19  ALT -- -- -- -- -- -- -- 8 -- -- -- -- -- -- 7  ALKPHOS -- -- -- -- -- -- -- 51 -- -- -- -- -- -- 50  BILITOT -- -- -- -- -- -- -- 0.3 -- -- -- -- -- -- 0.2*  PROT -- -- -- -- -- -- -- 6.7 -- -- -- -- -- -- 6.9  ALBUMIN 2.9* 2.7* 2.7* -- -- -- -- -- -- -- -- -- -- -- --  APTT -- -- -- -- -- -- 39* -- -- -- -- -- -- -- --  INR -- -- -- -- -- -- -- -- -- -- -- -- -- -- 1.16  LATICACIDVEN -- -- -- -- -- 0.8 -- -- -- 1.42 -- 1.28 -- -- --  TROPONINI -- --  -- -- -- -- <0.30 -- -- -- -- -- -- -- --  PROCALCITON -- -- -- -- -- -- 0.14 -- -- -- -- -- -- -- --  PROBNP -- -- -- -- -- -- -- -- -- -- -- -- -- 2985.0* --  O2SATVEN -- -- -- -- -- -- -- -- -- -- -- -- -- -- --  PHART -- -- -- -- 7.051* -- -- -- 7.009* -- -- -- -- -- --  PCO2ART -- -- -- -- 32.3* -- -- -- 43.6 -- -- -- -- -- --  PO2ART -- -- -- -- 140.0* -- -- -- 227.0* -- -- -- -- -- --    Lab 05/03/12 0705 05/03/12 0410 05/03/12 0342 05/02/12 2359 05/02/12 2015  GLUCAP 75 161* 63* 79 106*   IMAGING: 12/14  Abdominal X ray >>> No evidence of obstruction 12/14  PCXR >>>  Hardware well positioned, no overt airspace disease 12/14  Renal US >>> No hydronephrosis  ECG:  12/15  Sinus bradycardia with PACs on monitor  DIAGNOSES: Active Problems:  Paraplegia  HYPERTENSION, BENIGN  CORONARY ARTERY DISEASE  Acute renal failure  Encephalopathy acute  Acute respiratory failure  Hyperkalemia  Acidosis  ASSESSMENT / PLAN:  PULMONARY  A:  Acute respiratory failure due to inability to protect airway. P:   Gaol SpO2>92, pH>7.30 SBT with intent to extubate as encephalopathy resolving Albuterol PRN  CARDIOVASCULAR  A: Bradycardia secondary to hyperkalemia - improved.  Septic shock, likely secondary to urinary source - now off pressors. P:  Goal MAP>60  RENAL  A:  Acute on chronic renal failure - improving - CVVHD stopped.  Severe hyperkalemia - resolved, now mildly hypokalemic.  Hyponatremia - resolved. P:   Goal CVP>10 Trend BMP Renal following CVVH stopped  GASTROINTESTINAL  A:  Chronic colostomy. P:   NPO as intubated Swallowing evaluation after extubation  HEMATOLOGIC  A:  Chronic anemia. P:  Trend CBC  INFECTIOUS  A:  Sepsis, likely secondary to urinary source.  Recent ESBL Klebsiella in urine.  History of recurrent MRSA infection. P:   Cultures and antibiotics as above PCT  ENDOCRINE   A:  DM. P:   ICU Glycemic Control Protocol Phase  1  NEUROLOGIC  A:  Acute encephalopathy - improving. P:   Hold sedation for possible extubation  CLINICAL SUMMARY: 56 yo [facility] resident with paraplegia found to be lethargic and bradycardic. Intubated for airway protection. Lab work revealed K of 8.3, creatinine of 7.5 and severe metabolic acidosis.  Now off pressors with K normalized after short course of CVVHD. Encephalopathy improved.  Will wean and attempt to extubate.  I have personally obtained a history, examined the patient, evaluated laboratory and imaging results, formulated the assessment and plan and placed orders.  CRITICAL CARE:  The patient is critically ill with multiple organ systems failure and requires high complexity decision making for assessment and support, frequent evaluation and titration of therapies, application of advanced monitoring technologies and extensive interpretation of multiple databases. Critical Care Time devoted to patient care services described in this note is 30 minutes.   Lonia Farber, MD  Pulmonary and Critical Care Medicine [facility] Pager: [phone]  05/03/2012, 9:01 AM

## 2012-05-03 NOTE — Progress Notes (Signed)
[facility] ADULT ICU REPLACEMENT PROTOCOL FOR AM LAB REPLACEMENT ONLY  The patient does not apply for the [facility] Adult ICU Electrolyte Replacment Protocol based on the criteria listed below:   3. Is BUN < 30 mg/dL? no  Patient's BUN today is 43 4. Abnormal electrolyte(s): K+3.4     Cathlean Cower [facility] 05/03/2012 6:19 AM

## 2012-05-04 ENCOUNTER — Encounter (HOSPITAL_COMMUNITY): Payer: Self-pay | Admitting: General Practice

## 2012-05-04 DIAGNOSIS — I2510 Atherosclerotic heart disease of native coronary artery without angina pectoris: Secondary | ICD-10-CM

## 2012-05-04 DIAGNOSIS — E872 Acidosis: Secondary | ICD-10-CM

## 2012-05-04 DIAGNOSIS — J9600 Acute respiratory failure, unspecified whether with hypoxia or hypercapnia: Secondary | ICD-10-CM

## 2012-05-04 DIAGNOSIS — N179 Acute kidney failure, unspecified: Secondary | ICD-10-CM

## 2012-05-04 LAB — POCT I-STAT, CHEM 8
Calcium, Ion: 1.04 mmol/L — ABNORMAL LOW (ref 1.12–1.23)
Hemoglobin: 11.2 g/dL — ABNORMAL LOW (ref 13.0–17.0)
Sodium: 129 mEq/L — ABNORMAL LOW (ref 135–145)
TCO2: 13 mmol/L (ref 0–100)

## 2012-05-04 LAB — URINE MICROSCOPIC-ADD ON

## 2012-05-04 LAB — RENAL FUNCTION PANEL
Albumin: 2.6 g/dL — ABNORMAL LOW (ref 3.5–5.2)
BUN: 41 mg/dL — ABNORMAL HIGH (ref 6–23)
CO2: 32 mEq/L (ref 19–32)
Chloride: 97 mEq/L (ref 96–112)
GFR calc non Af Amer: 20 mL/min — ABNORMAL LOW (ref >90)
Potassium: 3 mEq/L — ABNORMAL LOW (ref 3.5–5.1)

## 2012-05-04 LAB — URINALYSIS, ROUTINE W REFLEX MICROSCOPIC
Glucose, UA: 250 mg/dL — AB
Ketones, ur: NEGATIVE mg/dL
Specific Gravity, Urine: 1.011 (ref 1.005–1.030)
pH: 8 (ref 5.0–8.0)

## 2012-05-04 LAB — MAGNESIUM: Magnesium: 1.9 mg/dL (ref 1.5–2.5)

## 2012-05-04 LAB — GLUCOSE, CAPILLARY
Glucose-Capillary: 122 mg/dL — ABNORMAL HIGH (ref 70–99)
Glucose-Capillary: 142 mg/dL — ABNORMAL HIGH (ref 70–99)

## 2012-05-04 MED ORDER — ONDANSETRON HCL 4 MG/2ML IJ SOLN: 4.0000 mg | Freq: Four times a day (QID) | INTRAMUSCULAR | Status: DC | PRN

## 2012-05-04 MED ORDER — PROMETHAZINE HCL 25 MG/ML IJ SOLN
25.0000 mg | Freq: Three times a day (TID) | INTRAMUSCULAR | Status: DC | PRN
  Administered 2012-05-05: 25 mg via INTRAVENOUS
  Filled 2012-05-04: qty 1

## 2012-05-04 MED ORDER — POTASSIUM CHLORIDE CRYS ER 20 MEQ PO TBCR
40.0000 meq | EXTENDED_RELEASE_TABLET | Freq: Once | ORAL | Status: AC
  Administered 2012-05-04: 40 meq via ORAL
  Filled 2012-05-04: qty 2

## 2012-05-04 MED ORDER — FENTANYL CITRATE 0.05 MG/ML IJ SOLN: 50.0000 ug | INTRAMUSCULAR | Status: DC | PRN

## 2012-05-04 NOTE — Progress Notes (Signed)
Clinical Social Work Department BRIEF PSYCHOSOCIAL ASSESSMENT 05/04/2012  Patient:  [name],[name]     Account Number:  [id number]     Admit date:  05/02/2012  Clinical Social Worker:  Dennison Bulla  Date/Time:  05/04/2012 11:15 AM  Referred by:  Physician  Date Referred:  05/04/2012 Referred for  SNF Placement   Other Referral:   Interview type:  Patient Other interview type:    PSYCHOSOCIAL DATA Living Status:  FACILITY Admitted from facility:  [facility] Level of care:  Skilled Nursing Facility Primary support name:  Sadie Primary support relationship to patient:  PARENT Degree of support available:   Strong    CURRENT CONCERNS Current Concerns  Post-Acute Placement   Other Concerns:    SOCIAL WORK ASSESSMENT / PLAN CSW received referral due to patient being admitted from a facility. CSW reviewed chart and met with patient at bedside. No family present.    CSW introduced myself and explained role. Patient reports that he has a close relationship with mother. Patient is divorced but has two grown children. Patient reports that he has been at [facility] for several years. Patient agreeable to return to SNF at dc. CSW called SNF who is agreeable to admission when medically dc.    CSW completed FL2 and placed in chart. CSW will continue to follow to assist with dc plans.   Assessment/plan status:  Psychosocial Support/Ongoing Assessment of Needs Other assessment/ plan:   Information/referral to community resources:   Will return to SNF    PATIENT'S/FAMILY'S RESPONSE TO PLAN OF CARE: Patient alert and oriented. Patient agreeable to CSW consult and appreciative of CSW assistance. Patient to return to SNF at dc.

## 2012-05-04 NOTE — Progress Notes (Addendum)
Subjective:  off CRRT > 24 hours Not oliguric at this time but creatinine has risen some since stop of CRRT Was give 40 mEq KCL this AM for low potassium No pain or shortness of breath No nausea or vomiting  Objective Vital signs in last 24 hours: Filed Vitals:   05/04/12 0200 05/04/12 0400 05/04/12 0404 05/04/12 0600  BP: 126/55 133/63  138/62  Pulse: 62 67  68  Temp:   99.1 F (37.3 C)   TempSrc:   Oral   Resp: 16 19  20   Height:      Weight:      SpO2: 97% 98%  96%   Weight change:   Intake/Output Summary (Last 24 hours) at 05/04/12 0745 Last data filed at 05/04/12 0604   Gross per 24 hour  Intake    790 ml  Output   1300 ml  Net   -510 ml   Physical Exam:  Blood pressure 138/62, pulse 68, temperature 99.1 F (37.3 C), temperature source Oral, resp. rate 20, height 5\' 11"  (1.803 m), weight 96.1 kg (211 lb 13.8 oz), SpO2 96.00%. Pleasant, awake, alert Says he doesn't know what happened to him - "I was out of it" NAD HD cath in left IJ position dressing intact Lungs clear to A S1S2 irreg No murmur or rub Abd with colostomy brown stool and SP tube clear urine Extremities contracted with some excorations; 1-2+ edema Foley draining clear yellow urine from suprapubic tube  Labs: Basic Metabolic Panel:  Lab 05/04/12 4098 05/03/12 0425 05/02/12 1650 05/02/12 1422 05/02/12 0900 05/02/12 0334 05/02/12 0209 05/02/12 0146  NA 140 137 137 137 130* 129* 128* --  K 3.0* 3.4* 4.2 4.4 6.7* 7.0* 8.2* --  CL 97 92* 97 99 96 96 110 --  CO2 32 31 22 20  12* 8* -- 10*  GLUCOSE 103* 104* 82 50* 175* 235* 132* --  BUN 41* 43* 80* 87* 115* 120* >140* --  CREATININE 3.30* 2.86* 4.91* 5.35* 7.20* 7.25* 7.50* --  ALB -- -- -- -- -- -- -- --  CALCIUM 7.4* 7.7* 7.4* 7.4* 7.6* 7.8* -- 8.0*  PHOS 5.4* 3.5 5.2* 5.9* 10.4* -- -- --   Liver Function Tests:  Lab 05/04/12 0425 05/03/12 0425 05/02/12 1650 05/02/12 0334 05/02/12 0146  AST -- -- -- 22 19  ALT -- -- -- 8 7  ALKPHOS -- -- --  51 50  BILITOT -- -- -- 0.3 0.2*  PROT -- -- -- 6.7 6.9  ALBUMIN 2.6* 2.9* 2.7* -- --   Lab 05/02/12 0900 05/02/12 0209 05/02/12 0204 05/02/12 0146  WBC 10.1 -- -- 9.1  NEUTROABS -- -- -- 6.5  HGB 8.8* 10.9* 11.2* 9.5*  HCT 28.6* 32.0* 33.0* 31.8*  MCV 90.2 -- -- 93.0  PLT 375 -- -- 358    Lab 05/02/12 0414  CKTOTAL --  CKMB --  CKMBINDEX --  TROPONINI <0.30   CBG:  Lab 05/04/12 0330 05/03/12 2340 05/03/12 1922 05/03/12 1518 05/03/12 1107  GLUCAP 114* 142* 84 80 64*   Blood cultures negative Urine culture positive >100,000 yeast  US Renal Port  05/02/2012  *RADIOLOGY REPORT*  Clinical Data: Acute on chronic renal failure  RENAL/URINARY TRACT ULTRASOUND COMPLETE  Comparison:  CT abdomen pelvis dated 08/29/2008  Findings:  Right Kidney:  Measures 11.0 cm.  Poorly visualized.  No mass or hydronephrosis.  Left Kidney:  Measures 13.9 cm.  No mass or hydronephrosis.  Bladder:  Decompressed by indwelling Foley catheter.  Additional comments: Coarse, echogenic hepatic parenchyma, nonspecific.  Possible perihepatic ascites.  IMPRESSION: No hydronephrosis.  Bladder decompressed by indwelling Foley catheter.   Original Report Authenticated By: Charline Bills, M.D.    Dg Abd Portable 1v  05/02/2012  *RADIOLOGY REPORT*  Clinical Data: Lower abdominal pain  PORTABLE ABDOMEN - 1 VIEW  Comparison: None.  Findings: No dilated loops of large or small bowel.  There is a moderate size stool ball in the rectum measuring 6.7 x 8.5 cm.  No pathologic calcifications.   There is chronic erosion and dislocation of the hips. Tip of NG tube projects over the GE junction region.  IMPRESSION:  1.  Stool ball the rectum. 2.  No evidence of bowel obstruction. 3.  Chronic dislocation.   Original Report Authenticated By: Genevive Bi, M.D.    Medications:   . antiseptic oral rinse  15 mL Mouth Rinse QID  . chlorhexidine  15 mL Mouth Rinse BID  . Chlorhexidine Gluconate Cloth  6 each Topical Q0600  .  heparin  5,000 Units Subcutaneous Q8H  . imipenem-cilastatin  250 mg Intravenous Q6H  . mupirocin ointment  1 application Nasal BID  . pantoprazole (PROTONIX) IV  40 mg Intravenous QHS  . vancomycin  1,000 mg Intravenous Q24H    ASSESSMENT/PLAN 1. Acute renal failure on CKD3:  Baseline chronic kidney disease stage III  (1.4-1.6)  likely from underlying diabetes and hypertension. Acute renal failure associated with what appears to be sepsis from urinary tract infection versus bradycardia associated hypotension, with ACE on board PTA  Non-oliguric since D/C of CRRT Creatinine has risen some but no indication for HD today Potassium was low - rec'd 40 K earlier today  2. Hyperkalemia: Likely secondary to acute renal failure in the face of ongoing ACE inhibitor therapy.  Corrected with kayexalate and CRRT (off this now). Now HYPOkalemia Received 40 K replacement today   3. Metabolic acidosis: Corrected with some overshoot  4. Urinary tract infection with sepsis: Primaxin/Vancomycin. Off pressors.  Culture with yeast Bacterial cultures pending ?Add diflucan  Camille Bal, MD [facility] [phone] pager 05/04/2012, 7:45 AM

## 2012-05-04 NOTE — H&P (Signed)
PULMONARY  / CRITICAL CARE MEDICINE  Name: [name] MRN: [id number] DOB: 03-Dec-1955    LOS: 2  REFERRING MD:  EDP  CHIEF COMPLAINT:  Acute encephalopathy  BRIEF PATIENT DESCRIPTION:  56 yo NH resident with paraplegia found to be lethargic and bradycardic. Intubated for airway protection. Lab work revealed K of 8.3, creatinine of 7.5 and severe metabolic acidosis.  LINES / TUBES: Suprapubic cath - preadmission OETT 12/14 >>>12/15 OGT  12/14 >>>12/15 HD cath 12/14 >>> R IJ CVL 12/14 >>>  CULTURES: 11/26  Urine >>> ESBL Klebsiella 12/14  Blood >>> 12/15  Respiratory >>> 12/14  Urine >>>  ANTIBIOTICS: Imipenem 12/14 >>> Vancomycin 12/14 >>>  SIGNIFICANT EVENTS:  12/14  Found to be lethargic and bradycardic. Intubated for airway protection. Lab work revealed K of 8.3, creatinine of 7.5 and severe metabolic acidosis. CVVHD. 12/15 - extibated  LEVEL OF CARE:  ICU  PRIMARY SERVICE:  PCCM  CONSULTANTS:  Renal  CODE STATUS:  Full  DIET:  NPO  DVT Px:  Heparin  GI Px:  Protonix  INTERVAL HISTORY:   BP wnl,off hd  VITAL SIGNS: Temp:  [98.1 F (36.7 C)-99.1 F (37.3 C)] 98.1 F (36.7 C) (12/16 1245) Pulse Rate:  [61-76] 72  (12/16 1400) Resp:  [15-22] 21  (12/16 1400) BP: (108-151)/(55-116) 121/72 mmHg (12/16 1400) SpO2:  [96 %-100 %] 97 % (12/16 1400) FiO2 (%):  [40 %] 40 % (12/15 1511)  HEMODYNAMICS:    VENTILATOR SETTINGS: Vent Mode:  [-] Spontaneous FiO2 (%):  [40 %] 40 % Set Rate:  [30 bmp] 30 bmp PEEP:  [5 cmH20] 5 cmH20 Pressure Support:  [5 cmH20] 5 cmH20  INTAKE / OUTPUT: Intake/Output      12/15 0701 - 12/16 0700 12/16 0701 - 12/17 0700   P.O.  240   I.V. (mL/kg) 400 (4.2) 130 (1.4)   NG/GT     IV Piggyback 410 100   Total Intake(mL/kg) 810 (8.4) 470 (4.9)   Urine (mL/kg/hr) 1300 (0.6) 1150 (1.6)   Emesis/NG output     Other     Stool  1   Total Output 1300 1151   Net -490 -681         PHYSICAL EXAMINATION: General:  Appears  acutely ill, no distress Neuro:  Encephalopathic improved  Cooperative, follows commands HEENT:  PERRL, OETT / OGT Cardiovascular:  RRR s 1 s 2 Lungs:  reduced Abdomen:  Soft, nontender, bowel sounds diminished, colostomy intact, suprapubic catheter Musculoskeletal:  Moves all extremities Skin:  Healing sacral decub  LABS:  Lab 05/04/12 0425 05/03/12 1525 05/03/12 0425 05/02/12 1650 05/02/12 0900 05/02/12 0626 05/02/12 0415 05/02/12 0414 05/02/12 0334 05/02/12 0222 05/02/12 0218 05/02/12 0209 05/02/12 0205 05/02/12 0204 05/02/12 0147 05/02/12 0146  HGB -- -- -- -- 8.8* -- -- -- -- -- -- 10.9* -- 11.2* -- --  WBC -- -- -- -- 10.1 -- -- -- -- -- -- -- -- -- -- 9.1  PLT -- -- -- -- 375 -- -- -- -- -- -- -- -- -- -- 358  NA 140 -- 137 137 -- -- -- -- -- -- -- -- -- -- -- --  K 3.0* -- 3.4* -- -- -- -- -- -- -- -- -- -- -- -- --  CL 97 -- 92* 97 -- -- -- -- -- -- -- -- -- -- -- --  CO2 32 -- 31 22 -- -- -- -- -- -- -- -- -- -- -- --  GLUCOSE 103* -- 104* 82 -- -- -- -- -- -- -- -- -- -- -- --  BUN 41* -- 43* 80* -- -- -- -- -- -- -- -- -- -- -- --  CREATININE 3.30* -- 2.86* 4.91* -- -- -- -- -- -- -- -- -- -- -- --  CALCIUM 7.4* -- 7.7* 7.4* -- -- -- -- -- -- -- -- -- -- -- --  MG 1.9 -- 1.9 -- 2.2 -- -- -- -- -- -- -- -- -- -- --  PHOS 5.4* -- 3.5 5.2* -- -- -- -- -- -- -- -- -- -- -- --  AST -- -- -- -- -- -- -- -- 22 -- -- -- -- -- -- 19  ALT -- -- -- -- -- -- -- -- 8 -- -- -- -- -- -- 7  ALKPHOS -- -- -- -- -- -- -- -- 51 -- -- -- -- -- -- 50  BILITOT -- -- -- -- -- -- -- -- 0.3 -- -- -- -- -- -- 0.2*  PROT -- -- -- -- -- -- -- -- 6.7 -- -- -- -- -- -- 6.9  ALBUMIN 2.6* -- 2.9* 2.7* -- -- -- -- -- -- -- -- -- -- -- --  APTT -- -- -- -- -- -- -- 39* -- -- -- -- -- -- -- --  INR -- -- -- -- -- -- -- -- -- -- -- -- -- -- -- 1.16  LATICACIDVEN -- -- -- -- -- -- 0.8 -- -- -- 1.42 -- 1.28 -- -- --  TROPONINI -- -- -- -- -- -- -- <0.30 -- -- -- -- -- -- -- --  PROCALCITON -- -- -- -- -- --  -- 0.14 -- -- -- -- -- -- -- --  PROBNP -- -- -- -- -- -- -- -- -- -- -- -- -- -- 2985.0* --  O2SATVEN -- -- -- -- -- -- -- -- -- -- -- -- -- -- -- --  PHART -- 7.453* -- -- -- 7.051* -- -- -- 7.009* -- -- -- -- -- --  PCO2ART -- 52.5* -- -- -- 32.3* -- -- -- 43.6 -- -- -- -- -- --  PO2ART -- 106.0* -- -- -- 140.0* -- -- -- 227.0* -- -- -- -- -- --    Lab 05/04/12 0330 05/03/12 2340 05/03/12 1922 05/03/12 1518 05/03/12 1107  GLUCAP 114* 142* 84 80 64*   IMAGING: 12/14  Abdominal X ray >>> No evidence of obstruction 12/14  PCXR >>>  Hardware well positioned, no overt airspace disease 12/14  Renal US >>> No hydronephrosis  ECG:  12/15  Sinus bradycardia with PACs on monitor  DIAGNOSES: Active Problems:  Paraplegia  HYPERTENSION, BENIGN  CORONARY ARTERY DISEASE  Acute renal failure  Encephalopathy acute  Acute respiratory failure  Hyperkalemia  Acidosis  ASSESSMENT / PLAN:  PULMONARY  A:  Acute respiratory failure due to inability to protect airway, improved, atx on last pcxr P:   IS pcxr follow up upright when able Neg balance as able  CARDIOVASCULAR  A: Bradycardia secondary to hyperkalemia - improved.  Septic shock, likely secondary to urinary source - now off pressors. P:  Goal MAP>60, on own Tele maintain  HR improved  RENAL  A:  Acute on chronic renal failure - improving - CVVHD stopped.  Severe hyperkalemia - resolved, now mildly hypokalemic.  Hyponatremia - resolved. P:   Dc catheter when renal dictates k supp Renal following CVVH stopped  GASTROINTESTINAL  A:  Chronic colostomy. P:   Diet  Add zofran If nausea further add KUB Follow output  HEMATOLOGIC  A:  Chronic anemia. P:  Trend CBC in am   INFECTIOUS  A:  ESBL uti, chronic suprapubic P:   Isolation Imipenem x 10-14 days repeat ua  ENDOCRINE   A:  DM. P:   ICU Glycemic Control Protocol Phase 1  NEUROLOGIC  A:  Acute encephalopathy - improving. P:   Awake  now OT  CLINICAL SUMMARY: 56 yo NH resident with paraplegia found to be lethargic and bradycardic. Intubated for airway protection. Lab work revealed K of 8.3, creatinine of 7.5 and severe metabolic acidosis. Extubated, doing well,  To tele, [facility]  Nelda Bucks., MD  Pulmonary and Critical Care Medicine [facility] Pager: [phone]  05/04/2012, 2:40 PM

## 2012-05-05 ENCOUNTER — Inpatient Hospital Stay (HOSPITAL_COMMUNITY): Payer: Medicare Other

## 2012-05-05 DIAGNOSIS — I10 Essential (primary) hypertension: Secondary | ICD-10-CM

## 2012-05-05 DIAGNOSIS — N390 Urinary tract infection, site not specified: Secondary | ICD-10-CM | POA: Diagnosis present

## 2012-05-05 LAB — RENAL FUNCTION PANEL
BUN: 39 mg/dL — ABNORMAL HIGH (ref 6–23)
CO2: 30 mEq/L (ref 19–32)
Chloride: 103 mEq/L (ref 96–112)
Creatinine, Ser: 3.37 mg/dL — ABNORMAL HIGH (ref 0.50–1.35)
Potassium: 3.6 mEq/L (ref 3.5–5.1)

## 2012-05-05 LAB — CULTURE, RESPIRATORY W GRAM STAIN

## 2012-05-05 LAB — MAGNESIUM: Magnesium: 1.8 mg/dL (ref 1.5–2.5)

## 2012-05-05 MED ORDER — FLUCONAZOLE 200 MG PO TABS
200.0000 mg | ORAL_TABLET | Freq: Every day | ORAL | Status: DC
  Administered 2012-05-05 – 2012-05-06 (×2): 200 mg via ORAL
  Filled 2012-05-05 (×2): qty 1

## 2012-05-05 MED ORDER — BOOST / RESOURCE BREEZE PO LIQD
1.0000 | Freq: Two times a day (BID) | ORAL | Status: DC
  Administered 2012-05-05 – 2012-05-07 (×4): 1 via ORAL

## 2012-05-05 MED ORDER — INSULIN ASPART 100 UNIT/ML ~~LOC~~ SOLN
0.0000 [IU] | Freq: Three times a day (TID) | SUBCUTANEOUS | Status: DC
  Administered 2012-05-06: 5 [IU] via SUBCUTANEOUS
  Administered 2012-05-06: 2 [IU] via SUBCUTANEOUS
  Administered 2012-05-07: 3 [IU] via SUBCUTANEOUS
  Administered 2012-05-07: 2 [IU] via SUBCUTANEOUS

## 2012-05-05 MED ORDER — BIOTENE DRY MOUTH MT LIQD
15.0000 mL | Freq: Two times a day (BID) | OROMUCOSAL | Status: DC
  Administered 2012-05-05 – 2012-05-07 (×4): 15 mL via OROMUCOSAL

## 2012-05-05 NOTE — Progress Notes (Signed)
NUTRITION FOLLOW UP  Intervention:   Add Resource Breeze po BID, each supplement provides 250 kcal and 9 grams of protein, to promote wound healing. RD to continue to follow nutrition care plan.  Nutrition Dx:   Inadequate oral intake related to inability to eat as evidenced by NPO. Resolved.  New Nutrition Dx: Increased nutrient needs r/t wound healing AEB estimated needs.  Goal:   Pt to meet >/= 90% of their estimated nutrition needs. Met  Monitor:   weight, labs, PO intake, I/O's, renal function  Assessment:   Extubated on 12/15. CRRT stopped on 12/15. Transferred from ICU on 12/17 to 6700. Nephrology continues to follow pt, creatinine is stabilizing in 3's, if stable tomorrow, renal plans to d/c HD catheter.  Advanced to renal 60-70 diet on 12/15. Pt reports that he has a great appetite, eating 100% of meals.  Height: Ht Readings from Last 1 Encounters:  05/02/12 5\' 11"  (1.803 m)   Weight Status:   Wt Readings from Last 1 Encounters:  05/04/12 204 lb 8 oz (92.761 kg)   Body mass index is 28.52 kg/(m^2).  Re-estimated needs:  Kcal: 1800 - 2000 kcal Protein: 85 - 100 grams Fluid: 1.8 - 2 liters  Skin: two stage II wounds  Diet Order: Renal 60-70   Intake/Output Summary (Last 24 hours) at 05/05/12 1348 Last data filed at 05/04/12 2115  Gross per 24 hour  Intake    710 ml  Output   1425 ml  Net   -715 ml   Last BM: 12/17 colostomy  Labs:   Lab 05/05/12 0450 05/04/12 0425 05/03/12 0425  NA 144 140 137  K 3.6 3.0* 3.4*  CL 103 97 92*  CO2 30 32 31  BUN 39* 41* 43*  CREATININE 3.37* 3.30* 2.86*  CALCIUM 8.3* 7.4* 7.7*  MG 1.8 1.9 1.9  PHOS 4.9* 5.4* 3.5  GLUCOSE 104* 103* 104*    CBG (last 3)   Basename 05/04/12 2237 05/04/12 0330 05/03/12 2340  GLUCAP 122* 114* 142*    Scheduled Meds:   . antiseptic oral rinse  15 mL Mouth Rinse BID  . Chlorhexidine Gluconate Cloth  6 each Topical Q0600  . fluconazole  200 mg Oral Daily  . heparin  5,000  Units Subcutaneous Q8H  . imipenem-cilastatin  250 mg Intravenous Q6H  . mupirocin ointment  1 application Nasal BID  . pantoprazole (PROTONIX) IV  40 mg Intravenous QHS   Continuous Infusions:   Jarold Motto MS, RD, LDN Pager: [phone] After-hours pager: [phone]

## 2012-05-05 NOTE — Progress Notes (Signed)
OT NOTE  OT order received. Pt resident of [place] and plans to return.  Will defer OT to SNF as appropriate. [facility], OTR/L  [phone] 05/05/2012

## 2012-05-05 NOTE — Progress Notes (Signed)
[facility] PROGRESS NOTE  [name] ZOX:[id number] DOB: [birth year] DOA: 05/02/2012 PCP: Terald Sleeper, MD  Assessment/Plan: 1. Sepsis  - Resolved with IV antibiotics - culture with yeast and diflucan added - blood cultures negative - Urine culture pending but prior urine culture grew out yeast  2 UTI - likely caused # 1 - continue current atibiotic regimen and would treat as complicated UTI.  3  Acute Renal Failure - Baseline reportedly (1.4-1.6) suspected to be secondary to underlying diabetes and hypertension - Nephrology on board and making further recommendations. - Avoid Ace inhibitor - Nephro considering d/cing hd cath if creatinine stable tomorrow  4. DM - Placed on SSI - hgba1c - CBG checks.  5. Hypokalemia Resolved.   Code Status: full Family Communication: Spoke with patient and wife in room Disposition Plan: Pending continued improvement in clinical condition and recommendations from nephrologist   Consultants:  Nephrology: [facility]  Procedures:  Please refer to chart  Antibiotics:  Primaxin  Diflucan  HPI/Subjective: Patient mentions that he is feeling better.  No acute issues reported overnight to me by patient.  Objective: Filed Vitals:   05/05/12 0426 05/05/12 1000 05/05/12 1300 05/05/12 1800  BP: 127/86 124/82 132/78 128/80  Pulse: 63 68 72 70  Temp: 98.5 F (36.9 C) 98.6 F (37 C) 98.4 F (36.9 C) 98.6 F (37 C)  TempSrc: Oral Oral Oral Oral  Resp: 20 22 20 20   Height:      Weight:      SpO2: 96% 98% 97% 98%    Intake/Output Summary (Last 24 hours) at 05/05/12 2017 Last data filed at 05/05/12 1700  Gross per 24 hour  Intake   1330 ml  Output   1900 ml  Net   -570 ml   Filed Weights   05/02/12 0500 05/03/12 0500 05/04/12 2242  Weight: 98.4 kg (216 lb 14.9 oz) 96.1 kg (211 lb 13.8 oz) 92.761 kg (204 lb 8 oz)    Exam:   General:  Pt in NAD, Alert and Oriented  Cardiovascular: RRR, No MRG  Respiratory:  CTA BL, no wheezes  Abdomen: soft, NT, ND  Data Reviewed: Basic Metabolic Panel:  Lab 05/05/12 8119 05/04/12 0425 05/03/12 0425 05/02/12 1650 05/02/12 1422 05/02/12 0900  NA 144 140 137 137 137 --  K 3.6 3.0* 3.4* 4.2 4.4 --  CL 103 97 92* 97 99 --  CO2 30 32 31 22 20  --  GLUCOSE 104* 103* 104* 82 50* --  BUN 39* 41* 43* 80* 87* --  CREATININE 3.37* 3.30* 2.86* 4.91* 5.35* --  CALCIUM 8.3* 7.4* 7.7* 7.4* 7.4* --  MG 1.8 1.9 1.9 -- -- 2.2  PHOS 4.9* 5.4* 3.5 5.2* 5.9* --   Liver Function Tests:  Lab 05/05/12 0450 05/04/12 0425 05/03/12 0425 05/02/12 1650 05/02/12 1422 05/02/12 0334 05/02/12 0146  AST -- -- -- -- -- 22 19  ALT -- -- -- -- -- 8 7  ALKPHOS -- -- -- -- -- 51 50  BILITOT -- -- -- -- -- 0.3 0.2*  PROT -- -- -- -- -- 6.7 6.9  ALBUMIN 2.6* 2.6* 2.9* 2.7* 2.7* -- --   No results found for this basename: LIPASE:5,AMYLASE:5 in the last 168 hours No results found for this basename: AMMONIA:5 in the last 168 hours CBC:  Lab 05/02/12 0900 05/02/12 0209 05/02/12 0204 05/02/12 0146  WBC 10.1 -- -- 9.1  NEUTROABS -- -- -- 6.5  HGB 8.8* 10.9* 11.2* 9.5*  HCT 28.6* 32.0* 33.0*  31.8*  MCV 90.2 -- -- 93.0  PLT 375 -- -- 358   Cardiac Enzymes:  Lab 05/02/12 0414  CKTOTAL --  CKMB --  CKMBINDEX --  TROPONINI <0.30   BNP (last 3 results)  Basename 05/02/12 0147  PROBNP 2985.0*   CBG:  Lab 05/04/12 2237 05/04/12 0330 05/03/12 2340 05/03/12 1922 05/03/12 1518  GLUCAP 122* 114* 142* 84 80    Recent Results (from the past 240 hour(s))  CULTURE, BLOOD (ROUTINE X 2)     Status: Normal (Preliminary result)   Collection Time   05/02/12  2:30 AM      Component Value Range Status Comment   Specimen Description BLOOD RIGHT ARM   Final    Special Requests BOTTLES DRAWN AEROBIC AND ANAEROBIC 9CC   Final    Culture  Setup Time 05/02/2012 15:40   Final    Culture     Final    Value:        BLOOD CULTURE RECEIVED NO GROWTH TO DATE CULTURE WILL BE HELD FOR 5 DAYS BEFORE  ISSUING A FINAL NEGATIVE REPORT   Report Status PENDING   Incomplete   CULTURE, BLOOD (ROUTINE X 2)     Status: Normal (Preliminary result)   Collection Time   05/02/12  2:55 AM      Component Value Range Status Comment   Specimen Description BLOOD CENTRAL LINE   Final    Special Requests BOTTLES DRAWN AEROBIC AND ANAEROBIC 10CC EACH   Final    Culture  Setup Time 05/02/2012 15:40   Final    Culture     Final    Value:        BLOOD CULTURE RECEIVED NO GROWTH TO DATE CULTURE WILL BE HELD FOR 5 DAYS BEFORE ISSUING A FINAL NEGATIVE REPORT   Report Status PENDING   Incomplete   MRSA PCR SCREENING     Status: Abnormal   Collection Time   05/02/12  5:00 AM      Component Value Range Status Comment   MRSA by PCR POSITIVE (*) NEGATIVE Final   URINE CULTURE     Status: Normal   Collection Time   05/02/12  5:01 AM      Component Value Range Status Comment   Specimen Description URINE, CATHETERIZED   Final    Special Requests Normal   Final    Culture  Setup Time 05/02/2012 14:05   Final    Colony Count >=100,000 COLONIES/ML   Final    Culture YEAST   Final    Report Status 05/03/2012 FINAL   Final   CULTURE, RESPIRATORY     Status: Normal   Collection Time   05/03/12  6:47 AM      Component Value Range Status Comment   Specimen Description TRACHEAL ASPIRATE   Final    Special Requests NONE   Final    Gram Stain     Final    Value: ABUNDANT WBC PRESENT, PREDOMINANTLY PMN     NO SQUAMOUS EPITHELIAL CELLS SEEN     NO ORGANISMS SEEN   Culture FEW CANDIDA ALBICANS   Final    Report Status 05/05/2012 FINAL   Final      Studies: Dg Chest Port 1 View  05/05/2012  *RADIOLOGY REPORT*  Clinical Data: Cough and congestion  PORTABLE CHEST - 1 VIEW  Comparison: 05/02/2012  Findings: Lung volumes remain low.  There is opacity at the right lung base mostly obscuring the right heart border and  right hemidiaphragm.  This is consistent with right lower and middle lobe atelectasis.  Infiltrate is not  excluded.  There is some mild discoid atelectasis in the aerated right upper lobe and minor subsegmental atelectasis or scarring at the left lung base.  No pulmonary edema.  No pneumothorax.  Left internal jugular dual lumen central venous catheter has its tip in the mid superior vena cava.  Right internal jugular central venous line has its tip in the superior aspect of the superior vena cava.  The nasogastric tube has been removed as has the endotracheal tube.  IMPRESSION: Right-sided atelectasis involving the right middle and lower lobes. Infiltrate should be considered in the proper clinical setting. This is essentially stable.  No pulmonary edema.  Remaining support apparatus is stable well-positioned.   Original Report Authenticated By: Amie Portland, M.D.     Scheduled Meds:   . antiseptic oral rinse  15 mL Mouth Rinse BID  . Chlorhexidine Gluconate Cloth  6 each Topical Q0600  . feeding supplement  1 Container Oral BID BM  . fluconazole  200 mg Oral Daily  . heparin  5,000 Units Subcutaneous Q8H  . imipenem-cilastatin  250 mg Intravenous Q6H  . mupirocin ointment  1 application Nasal BID  . pantoprazole (PROTONIX) IV  40 mg Intravenous QHS   Continuous Infusions:   Active Problems:  Paraplegia  HYPERTENSION, BENIGN  CORONARY ARTERY DISEASE  Acute renal failure  Encephalopathy acute  Acute respiratory failure  Hyperkalemia  Acidosis    Time spent: > 35 minutes    Penny Pia  [facility] Pager [phone] If 8PM-8AM, please contact night-coverage at [email or web address], password [facility] 05/05/2012, 8:17 PM  LOS: 3 days

## 2012-05-05 NOTE — Progress Notes (Signed)
Subjective:  Transferred to floor Good UOP via SP tube (over 2 liters yesterday Creatinine "stalled" in the 3's (CRRT stopped on 12/15)  Objective Vital signs in last 24 hours: Filed Vitals:   05/04/12 2115 05/04/12 2200 05/04/12 2242 05/05/12 0426  BP: 144/74 141/82 177/46 127/86  Pulse: 63 65 121 63  Temp:   98.5 F (36.9 C) 98.5 F (36.9 C)  TempSrc:   Oral Oral  Resp: 23 22 21 20   Height:      Weight:   92.761 kg (204 lb 8 oz)   SpO2: 98% 99% 98% 96%   Weight change:   Intake/Output Summary (Last 24 hours) at 05/05/12 1314 Last data filed at 05/04/12 2115  Gross per 24 hour  Intake    710 ml  Output   1425 ml  Net   -715 ml   Physical Exam:  Blood pressure 127/86, pulse 63, temperature 98.5 F (36.9 C), temperature source Oral, resp. rate 20, height 5\' 11"  (1.803 m), weight 92.761 kg (204 lb 8 oz), SpO2 96.00%.   Still not clear on sequence of events leading to hosp Left IJ temp cath site OK Lungs clear to A  S1S2 irreg No murmur or rub  Abd with colostomy brown stool and SP tube clear urine  Extremities contracted with some excorations; 1-2+ edema unchanged  Foley draining clear yellow urine from suprapubic tube  Labs: Basic Metabolic Panel:  Lab 05/05/12 4098 05/04/12 0425 05/03/12 0425 05/02/12 1650 05/02/12 1422 05/02/12 0900 05/02/12 0334  NA 144 140 137 137 137 130* 129*  K 3.6 3.0* 3.4* 4.2 4.4 6.7* 7.0*  CL 103 97 92* 97 99 96 96  CO2 30 32 31 22 20  12* 8*  GLUCOSE 104* 103* 104* 82 50* 175* 235*  BUN 39* 41* 43* 80* 87* 115* 120*  CREATININE 3.37* 3.30* 2.86* 4.91* 5.35* 7.20* 7.25*  ALB -- -- -- -- -- -- --  CALCIUM 8.3* 7.4* 7.7* 7.4* 7.4* 7.6* 7.8*  PHOS 4.9* 5.4* 3.5 5.2* 5.9* 10.4* --   Liver Function Tests:  Lab 05/05/12 0450 05/04/12 0425 05/03/12 0425 05/02/12 0334 05/02/12 0146  AST -- -- -- 22 19  ALT -- -- -- 8 7  ALKPHOS -- -- -- 51 50  BILITOT -- -- -- 0.3 0.2*  PROT -- -- -- 6.7 6.9  ALBUMIN 2.6* 2.6* 2.9* -- --  CBC:  Lab  05/02/12 0900 05/02/12 0209 05/02/12 0204 05/02/12 0146  WBC 10.1 -- -- 9.1  NEUTROABS -- -- -- 6.5  HGB 8.8* 10.9* 11.2* 9.5*  HCT 28.6* 32.0* 33.0* 31.8*  MCV 90.2 -- -- 93.0  PLT 375 -- -- 358    CBG:  Lab 05/04/12 2237 05/04/12 0330 05/03/12 2340 05/03/12 1922 05/03/12 1518  GLUCAP 122* 114* 142* 84 80   Studies/Results: Dg Chest Port 1 View  05/05/2012  *RADIOLOGY REPORT*  Clinical Data: Cough and congestion  PORTABLE CHEST - 1 VIEW  Comparison: 05/02/2012  Findings: Lung volumes remain low.  There is opacity at the right lung base mostly obscuring the right heart border and right hemidiaphragm.  This is consistent with right lower and middle lobe atelectasis.  Infiltrate is not excluded.  There is some mild discoid atelectasis in the aerated right upper lobe and minor subsegmental atelectasis or scarring at the left lung base.  No pulmonary edema.  No pneumothorax.  Left internal jugular dual lumen central venous catheter has its tip in the mid superior vena cava.  Right  internal jugular central venous line has its tip in the superior aspect of the superior vena cava.  The nasogastric tube has been removed as has the endotracheal tube.  IMPRESSION: Right-sided atelectasis involving the right middle and lower lobes. Infiltrate should be considered in the proper clinical setting. This is essentially stable.  No pulmonary edema.  Remaining support apparatus is stable well-positioned.   Original Report Authenticated By: Amie Portland, M.D.    Medications:      . antiseptic oral rinse  15 mL Mouth Rinse BID  . Chlorhexidine Gluconate Cloth  6 each Topical Q0600  . heparin  5,000 Units Subcutaneous Q8H  . imipenem-cilastatin  250 mg Intravenous Q6H  . mupirocin ointment  1 application Nasal BID  . pantoprazole (PROTONIX) IV  40 mg Intravenous QHS    I  have reviewed scheduled and prn medications.  ASSESSMENT/RECOMMENDATIONS 56 yo paraplegic, long term SNF resident, with h/o CAD, CABG,  h/o recurrent MRSA infections, recurent UTI's, chronic SP catheter, colostomy, GERD, on outpt ATB's PTA for GNR Donalee Citrin) UTI, who presented with lethargy, severe bradycardia (20's) and hypotension, VDRF, and acute renal failure and severe hyperkalemia (8.3) with ACE inhibitor on board PTA  1. Acute renal failure on CKD3:  Baseline chronic kidney disease stage III (1.4-1.6 in 2012) likely from underlying diabetes and hypertension.  Acute renal failure associated with what appeared to be sepsis from urinary tract infection versus bradycardia associated hypotension, with ACE on board PTA  Non-oliguric since D/C of CRRT on 12/15 Creatinine at plateau in 3's If stable tomorrow will D/C HD cath Not sure how low he will settle out  I would personally not use ACE inhibitor in this patient any more  2. Urinary tract infection with sepsis Now on primaxin Culture with yeast  Bacterial cultures pending  Add diflucan  3. DM 4. HTN - would avoid ACE in future 5. CAD h/o CABG 6. Anemia 7. Gout 8. Paraplegia 9. Chronic SP tube - geets monthly tube change  Camille Bal, MD [facility] [phone] pager 05/05/2012, 1:14 PM

## 2012-05-06 DIAGNOSIS — D649 Anemia, unspecified: Secondary | ICD-10-CM

## 2012-05-06 LAB — IRON AND TIBC
Iron: 84 ug/dL (ref 42–135)
TIBC: 260 ug/dL (ref 215–435)
UIBC: 176 ug/dL (ref 125–400)

## 2012-05-06 LAB — HEMOGLOBIN A1C
Hgb A1c MFr Bld: 5 % (ref <5.7)
Mean Plasma Glucose: 97 mg/dL (ref <117)

## 2012-05-06 LAB — RENAL FUNCTION PANEL
Albumin: 2.6 g/dL — ABNORMAL LOW (ref 3.5–5.2)
BUN: 39 mg/dL — ABNORMAL HIGH (ref 6–23)
Calcium: 8.5 mg/dL (ref 8.4–10.5)
Chloride: 105 mEq/L (ref 96–112)
Creatinine, Ser: 3.06 mg/dL — ABNORMAL HIGH (ref 0.50–1.35)
GFR calc non Af Amer: 22 mL/min — ABNORMAL LOW (ref >90)
Phosphorus: 4.4 mg/dL (ref 2.3–4.6)

## 2012-05-06 LAB — URINE CULTURE: Culture: NO GROWTH

## 2012-05-06 LAB — GLUCOSE, CAPILLARY: Glucose-Capillary: 207 mg/dL — ABNORMAL HIGH (ref 70–99)

## 2012-05-06 LAB — CBC
Hemoglobin: 8 g/dL — ABNORMAL LOW (ref 13.0–17.0)
MCH: 28.1 pg (ref 26.0–34.0)
MCHC: 30.5 g/dL (ref 30.0–36.0)
Platelets: 347 10*3/uL (ref 150–400)
RBC: 2.85 MIL/uL — ABNORMAL LOW (ref 4.22–5.81)

## 2012-05-06 LAB — MAGNESIUM: Magnesium: 1.9 mg/dL (ref 1.5–2.5)

## 2012-05-06 LAB — TSH: TSH: 1.421 u[IU]/mL (ref 0.350–4.500)

## 2012-05-06 LAB — FERRITIN: Ferritin: 105 ng/mL (ref 22–322)

## 2012-05-06 MED ORDER — FLUCONAZOLE 100 MG PO TABS
100.0000 mg | ORAL_TABLET | Freq: Every day | ORAL | Status: DC
  Administered 2012-05-07: 100 mg via ORAL
  Filled 2012-05-06: qty 1

## 2012-05-06 MED ORDER — SODIUM CHLORIDE 0.9 % IJ SOLN
10.0000 mL | INTRAMUSCULAR | Status: DC | PRN
  Administered 2012-05-06: 20 mL

## 2012-05-06 MED ORDER — SODIUM CHLORIDE 0.9 % IJ SOLN: 10.0000 mL | Freq: Two times a day (BID) | INTRAMUSCULAR | Status: DC

## 2012-05-06 MED ORDER — AMLODIPINE BESYLATE 5 MG PO TABS
5.0000 mg | ORAL_TABLET | Freq: Every day | ORAL | Status: DC
  Administered 2012-05-06 – 2012-05-07 (×2): 5 mg via ORAL
  Filled 2012-05-06 (×2): qty 1

## 2012-05-06 MED ORDER — GUAIFENESIN-DM 100-10 MG/5ML PO SYRP
5.0000 mL | ORAL_SOLUTION | ORAL | Status: DC | PRN
  Administered 2012-05-06: 5 mL via ORAL
  Filled 2012-05-06: qty 5

## 2012-05-06 MED ORDER — HYDRALAZINE HCL 10 MG PO TABS
10.0000 mg | ORAL_TABLET | Freq: Four times a day (QID) | ORAL | Status: DC | PRN
  Administered 2012-05-06 – 2012-05-07 (×2): 10 mg via ORAL
  Filled 2012-05-06 (×2): qty 1

## 2012-05-06 MED ORDER — PANTOPRAZOLE SODIUM 40 MG PO TBEC
40.0000 mg | DELAYED_RELEASE_TABLET | Freq: Every day | ORAL | Status: DC
  Administered 2012-05-06 – 2012-05-07 (×2): 40 mg via ORAL
  Filled 2012-05-06 (×2): qty 1

## 2012-05-06 NOTE — Progress Notes (Signed)
Subjective: very talkative No complaints of pain or SOB Informed of kidney function improvement Told him not sure where it would settle out  Objective Vital signs in last 24 hours: Filed Vitals:   05/05/12 1800 05/05/12 2124 05/06/12 0609 05/06/12 0900  BP: 128/80 157/85 151/58 176/77  Pulse: 70 59 65 72  Temp: 98.6 F (37 C) 98.2 F (36.8 C) 98.4 F (36.9 C) 98.3 F (36.8 C)  TempSrc: Oral Oral Oral   Resp: 20 18 17 18   Height:      Weight:  94.802 kg (209 lb)    SpO2: 98% 98% 98% 97%   Weight change: 2.041 kg (4 lb 8 oz)  Intake/Output Summary (Last 24 hours) at 05/06/12 1245 Last data filed at 05/06/12 0959  Gross per 24 hour  Intake    840 ml  Output   2700 ml  Net  -1860 ml   Physical Exam:  Blood pressure 176/77, pulse 72, temperature 98.3 F (36.8 C), temperature source Oral, resp. rate 18, height 5\' 11"  (1.803 m), weight 94.802 kg (209 lb), SpO2 97.00%. Left IJ temp cath site OK  Lungs clear to A  S1S2 irreg No murmur or rub  Abd with colostomy brown stool and SP tube clear urine  Extremities contracted with some excorations; 1-2+ edema unchanged  Foley draining clear yellow urine from suprapubic tube  Labs: Basic Metabolic Panel:  Lab 05/06/12 1610 05/05/12 0450 05/04/12 0425 05/03/12 0425 05/02/12 1650 05/02/12 1422 05/02/12 0900  NA 142 144 140 137 137 137 130*  K 3.6 3.6 3.0* 3.4* 4.2 4.4 6.7*  CL 105 103 97 92* 97 99 96  CO2 26 30 32 31 22 20  12*  GLUCOSE 120* 104* 103* 104* 82 50* 175*  BUN 39* 39* 41* 43* 80* 87* 115*  CREATININE 3.06* 3.37* 3.30* 2.86* 4.91* 5.35* 7.20*  ALB -- -- -- -- -- -- --  CALCIUM 8.5 8.3* 7.4* 7.7* 7.4* 7.4* 7.6*  PHOS 4.4 4.9* 5.4* 3.5 5.2* 5.9* 10.4*   Liver Function Tests:  Lab 05/06/12 0500 05/05/12 0450 05/04/12 0425 05/02/12 0334 05/02/12 0146  AST -- -- -- 22 19  ALT -- -- -- 8 7  ALKPHOS -- -- -- 51 50  BILITOT -- -- -- 0.3 0.2*  PROT -- -- -- 6.7 6.9  ALBUMIN 2.6* 2.6* 2.6* -- --  CBC:  Lab 05/06/12  0500 05/02/12 0900 05/02/12 0209 05/02/12 0204 05/02/12 0146  WBC 8.3 10.1 -- -- 9.1  NEUTROABS -- -- -- -- 6.5  HGB 8.0* 8.8* 10.9* 11.2* --  HCT 26.2* 28.6* 32.0* 33.0* --  MCV 91.9 90.2 -- -- 93.0  PLT 347 375 -- -- 358   CBG:  Lab 05/06/12 1134 05/06/12 0733 05/05/12 2101 05/04/12 2237 05/04/12 0330  GLUCAP 207* 113* 150* 122* 114*    Studies/Results: Dg Chest Port 1 View  05/05/2012  *RADIOLOGY REPORT*  Clinical Data: Cough and congestion  PORTABLE CHEST - 1 VIEW  Comparison: 05/02/2012  Findings: Lung volumes remain low.  There is opacity at the right lung base mostly obscuring the right heart border and right hemidiaphragm.  This is consistent with right lower and middle lobe atelectasis.  Infiltrate is not excluded.  There is some mild discoid atelectasis in the aerated right upper lobe and minor subsegmental atelectasis or scarring at the left lung base.  No pulmonary edema.  No pneumothorax.  Left internal jugular dual lumen central venous catheter has its tip in the mid superior vena  cava.  Right internal jugular central venous line has its tip in the superior aspect of the superior vena cava.  The nasogastric tube has been removed as has the endotracheal tube.  IMPRESSION: Right-sided atelectasis involving the right middle and lower lobes. Infiltrate should be considered in the proper clinical setting. This is essentially stable.  No pulmonary edema.  Remaining support apparatus is stable well-positioned.   Original Report Authenticated By: Amie Portland, M.D.    Medications:      . antiseptic oral rinse  15 mL Mouth Rinse BID  . feeding supplement  1 Container Oral BID BM  . fluconazole  100 mg Oral Daily  . heparin  5,000 Units Subcutaneous Q8H  . imipenem-cilastatin  250 mg Intravenous Q6H  . insulin aspart  0-15 Units Subcutaneous TID WC  . mupirocin ointment  1 application Nasal BID  . pantoprazole  40 mg Oral Daily    I  have reviewed scheduled and prn  medications.  ASSESSMENT/RECOMMENDATIONS   56 yo paraplegic, long term SNF resident, with h/o CAD, CABG, h/o recurrent MRSA infections, recurent UTI's, chronic SP catheter, colostomy, GERD, on outpt ATB's PTA for GNR [name]) UTI, who presented with lethargy, severe bradycardia (20's) and hypotension, VDRF, and acute renal failure and severe hyperkalemia (8.3) with ACE inhibitor on board PTA   1. Acute renal failure on CKD3:  Baseline chronic kidney disease stage III (1.4-1.6 in 2012; no data from 2013)  likely from underlying diabetes and hypertension.  Acute renal failure associated with what appeared to be sepsis from urinary tract infection versus bradycardia associated hypotension, with ACE on board PTA  Non-oliguric since D/C of CRRT on 12/15  Creatinine slow improvement Not sure where will settle out  2. Urinary tract infection with sepsis  Now on primaxin/diflucan    3. DM  4. HTN - would avoid ACE in future  5. CAD h/o CABG  6. Anemia  7. Gout  8. Paraplegia  9. Chronic SP tube - geets monthly tube change   Final Recs:  I would personally not use ACE inhibitor  (or ARB either) in this patient any more - ever D/C dialysis catheter When goes back to SNF should have weekly renal panel X 4 If does NOT settle out at previous "known" baseline, SNF MD can arrange f/u with [place] at [facility] Will sign off but if additional questions please call.    Camille Bal, MD [facility] [phone] pager 05/06/2012, 12:45 PM

## 2012-05-06 NOTE — Progress Notes (Addendum)
[facility] PROGRESS NOTE  Jameil Whitmoyer ZOX:[id number] DOB: 1955/06/21 DOA: 05/02/2012 PCP: Terald Sleeper, MD  Brief narrative 56 yo NH resident with paraplegia found to be lethargic and bradycardic. Intubated for airway protection. Labs showed  of 8.3, creatinine of 7.5 and severe metabolic acidosis with sepsis.    Assessment/Plan:  . Sepsis  Likely due to UTI  - Resolved with IV antibiotics  - culture with yeast and diflucan added  - blood cultures negative  - Urine culture  Negative  but prior urine culture grew out yeast    UTI  - continue with primaxin  and would treat as complicated UTI. Plan to treat for 10-14 days ( day 5 ). Will switch to suitable oral regimen on discharge   Acute kidney injury - Baseline reportedly (1.4-1.6). No renal fn available in system since 08/12 .  - Nephrology on board and making further recommendations. Renal fn slowly improving - Avoid ACEi or ARB. - D/C HD catheter.   Hypertension  elevated BP noted. Will add low dose amlodipine  . DM  - Placed on SSI  - hgba1c of 5 .    Marland Kitchen Hypokalemia  Resolved.   Anemia  possibly related to renal disease. Will check Iron pane, TSH, , B12 and folate   Code Status: full  Family Communication: Spoke with patient and wife in room  Disposition Plan: likely back to NH in 1-2 days if renal fn continues to improve  Consultants:  Nephrology:  Procedures:   Antibiotics:  Primaxin (day  5) Diflucan       HPI/Subjective: Feels better overall. C/o mild suprapubic discomfort  Objective: Filed Vitals:   05/05/12 1800 05/05/12 2124 05/06/12 0609 05/06/12 0900  BP: 128/80 157/85 151/58 176/77  Pulse: 70 59 65 72  Temp: 98.6 F (37 C) 98.2 F (36.8 C) 98.4 F (36.9 C) 98.3 F (36.8 C)  TempSrc: Oral Oral Oral   Resp: 20 18 17 18   Height:      Weight:  94.802 kg (209 lb)    SpO2: 98% 98% 98% 97%    Intake/Output Summary (Last 24 hours) at 05/06/12 1320 Last data filed at  05/06/12 0959  Gross per 24 hour  Intake    480 ml  Output   2100 ml  Net  -1620 ml   Filed Weights   05/03/12 0500 05/04/12 2242 05/05/12 2124  Weight: 96.1 kg (211 lb 13.8 oz) 92.761 kg (204 lb 8 oz) 94.802 kg (209 lb)    Exam:   General:  Related male in no acute distress  HEENT: No pallor, moist oral mucosa, left-sided is the catheter.  Cardiovascular: S1 and S2, no murmurs rub or gallop  Respiratory: Clear to auscultation bilaterally  Abdomen: Soft, nontender, nondistended, colostomy bag and suprapubic catheter in place, out sounds present  Extremities: Warm, no edema,  CNS: AAO x3  Data Reviewed: Basic Metabolic Panel:  Lab 05/06/12 8119 05/05/12 0450 05/04/12 0425 05/03/12 0425 05/02/12 1650 05/02/12 0900  NA 142 144 140 137 137 --  K 3.6 3.6 3.0* 3.4* 4.2 --  CL 105 103 97 92* 97 --  CO2 26 30 32 31 22 --  GLUCOSE 120* 104* 103* 104* 82 --  BUN 39* 39* 41* 43* 80* --  CREATININE 3.06* 3.37* 3.30* 2.86* 4.91* --  CALCIUM 8.5 8.3* 7.4* 7.7* 7.4* --  MG 1.9 1.8 1.9 1.9 -- 2.2  PHOS 4.4 4.9* 5.4* 3.5 5.2* --   Liver Function Tests:  Lab 05/06/12  0500 05/05/12 0450 05/04/12 0425 05/03/12 0425 05/02/12 1650 05/02/12 0334 05/02/12 0146  AST -- -- -- -- -- 22 19  ALT -- -- -- -- -- 8 7  ALKPHOS -- -- -- -- -- 51 50  BILITOT -- -- -- -- -- 0.3 0.2*  PROT -- -- -- -- -- 6.7 6.9  ALBUMIN 2.6* 2.6* 2.6* 2.9* 2.7* -- --   No results found for this basename: LIPASE:5,AMYLASE:5 in the last 168 hours No results found for this basename: AMMONIA:5 in the last 168 hours CBC:  Lab 05/06/12 0500 05/02/12 0900 05/02/12 0209 05/02/12 0204 05/02/12 0146  WBC 8.3 10.1 -- -- 9.1  NEUTROABS -- -- -- -- 6.5  HGB 8.0* 8.8* 10.9* 11.2* 9.5*  HCT 26.2* 28.6* 32.0* 33.0* 31.8*  MCV 91.9 90.2 -- -- 93.0  PLT 347 375 -- -- 358   Cardiac Enzymes:  Lab 05/02/12 0414  CKTOTAL --  CKMB --  CKMBINDEX --  TROPONINI <0.30   BNP (last 3 results)  Basename 05/02/12 0147   PROBNP 2985.0*   CBG:  Lab 05/06/12 1134 05/06/12 0733 05/05/12 2101 05/04/12 2237 05/04/12 0330  GLUCAP 207* 113* 150* 122* 114*    Recent Results (from the past 240 hour(s))  CULTURE, BLOOD (ROUTINE X 2)     Status: Normal (Preliminary result)   Collection Time   05/02/12  2:30 AM      Component Value Range Status Comment   Specimen Description BLOOD RIGHT ARM   Final    Special Requests BOTTLES DRAWN AEROBIC AND ANAEROBIC 9CC   Final    Culture  Setup Time 05/02/2012 15:40   Final    Culture     Final    Value:        BLOOD CULTURE RECEIVED NO GROWTH TO DATE CULTURE WILL BE HELD FOR 5 DAYS BEFORE ISSUING A FINAL NEGATIVE REPORT   Report Status PENDING   Incomplete   CULTURE, BLOOD (ROUTINE X 2)     Status: Normal (Preliminary result)   Collection Time   05/02/12  2:55 AM      Component Value Range Status Comment   Specimen Description BLOOD CENTRAL LINE   Final    Special Requests BOTTLES DRAWN AEROBIC AND ANAEROBIC 10CC EACH   Final    Culture  Setup Time 05/02/2012 15:40   Final    Culture     Final    Value:        BLOOD CULTURE RECEIVED NO GROWTH TO DATE CULTURE WILL BE HELD FOR 5 DAYS BEFORE ISSUING A FINAL NEGATIVE REPORT   Report Status PENDING   Incomplete   MRSA PCR SCREENING     Status: Abnormal   Collection Time   05/02/12  5:00 AM      Component Value Range Status Comment   MRSA by PCR POSITIVE (*) NEGATIVE Final   URINE CULTURE     Status: Normal   Collection Time   05/02/12  5:01 AM      Component Value Range Status Comment   Specimen Description URINE, CATHETERIZED   Final    Special Requests Normal   Final    Culture  Setup Time 05/02/2012 14:05   Final    Colony Count >=100,000 COLONIES/ML   Final    Culture YEAST   Final    Report Status 05/03/2012 FINAL   Final   CULTURE, RESPIRATORY     Status: Normal   Collection Time   05/03/12  6:47 AM  Component Value Range Status Comment   Specimen Description TRACHEAL ASPIRATE   Final    Special  Requests NONE   Final    Gram Stain     Final    Value: ABUNDANT WBC PRESENT, PREDOMINANTLY PMN     NO SQUAMOUS EPITHELIAL CELLS SEEN     NO ORGANISMS SEEN   Culture FEW CANDIDA ALBICANS   Final    Report Status 05/05/2012 FINAL   Final   URINE CULTURE     Status: Normal   Collection Time   05/04/12  3:51 PM      Component Value Range Status Comment   Specimen Description URINE, CLEAN CATCH   Final    Special Requests NONE   Final    Culture  Setup Time 05/04/2012 17:28   Final    Colony Count NO GROWTH   Final    Culture NO GROWTH   Final    Report Status 05/06/2012 FINAL   Final      Studies: Dg Chest Port 1 View  05/05/2012  *RADIOLOGY REPORT*  Clinical Data: Cough and congestion  PORTABLE CHEST - 1 VIEW  Comparison: 05/02/2012  Findings: Lung volumes remain low.  There is opacity at the right lung base mostly obscuring the right heart border and right hemidiaphragm.  This is consistent with right lower and middle lobe atelectasis.  Infiltrate is not excluded.  There is some mild discoid atelectasis in the aerated right upper lobe and minor subsegmental atelectasis or scarring at the left lung base.  No pulmonary edema.  No pneumothorax.  Left internal jugular dual lumen central venous catheter has its tip in the mid superior vena cava.  Right internal jugular central venous line has its tip in the superior aspect of the superior vena cava.  The nasogastric tube has been removed as has the endotracheal tube.  IMPRESSION: Right-sided atelectasis involving the right middle and lower lobes. Infiltrate should be considered in the proper clinical setting. This is essentially stable.  No pulmonary edema.  Remaining support apparatus is stable well-positioned.   Original Report Authenticated By: Amie Portland, M.D.     Scheduled Meds:   . antiseptic oral rinse  15 mL Mouth Rinse BID  . feeding supplement  1 Container Oral BID BM  . fluconazole  100 mg Oral Daily  . heparin  5,000 Units  Subcutaneous Q8H  . imipenem-cilastatin  250 mg Intravenous Q6H  . insulin aspart  0-15 Units Subcutaneous TID WC  . mupirocin ointment  1 application Nasal BID  . pantoprazole  40 mg Oral Daily   Continuous Infusions:      Time spent: 30  MINUTES    Faline Langer  [facility] Pager [phone]. If 8PM-8AM, please contact night-coverage at [email or web address], password [facility] 05/06/2012, 1:20 PM  LOS: 4 days
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
## 2012-05-07 DIAGNOSIS — A419 Sepsis, unspecified organism: Secondary | ICD-10-CM | POA: Diagnosis present

## 2012-05-07 DIAGNOSIS — N189 Chronic kidney disease, unspecified: Secondary | ICD-10-CM

## 2012-05-07 LAB — CBC
Hemoglobin: 8.1 g/dL — ABNORMAL LOW (ref 13.0–17.0)
Platelets: 354 10*3/uL (ref 150–400)
RBC: 2.94 MIL/uL — ABNORMAL LOW (ref 4.22–5.81)
WBC: 7.8 10*3/uL (ref 4.0–10.5)

## 2012-05-07 LAB — GLUCOSE, CAPILLARY
Glucose-Capillary: 125 mg/dL — ABNORMAL HIGH (ref 70–99)
Glucose-Capillary: 133 mg/dL — ABNORMAL HIGH (ref 70–99)
Glucose-Capillary: 196 mg/dL — ABNORMAL HIGH (ref 70–99)

## 2012-05-07 LAB — RENAL FUNCTION PANEL
CO2: 25 mEq/L (ref 19–32)
Chloride: 108 mEq/L (ref 96–112)
Creatinine, Ser: 2.86 mg/dL — ABNORMAL HIGH (ref 0.50–1.35)
GFR calc Af Amer: 27 mL/min — ABNORMAL LOW (ref >90)
GFR calc non Af Amer: 23 mL/min — ABNORMAL LOW (ref >90)
Glucose, Bld: 121 mg/dL — ABNORMAL HIGH (ref 70–99)
Sodium: 142 mEq/L (ref 135–145)

## 2012-05-07 LAB — MAGNESIUM: Magnesium: 1.8 mg/dL (ref 1.5–2.5)

## 2012-05-07 MED ORDER — INSULIN GLARGINE 100 UNIT/ML ~~LOC~~ SOLN: 15.0000 [IU] | Freq: Every day | SUBCUTANEOUS | Status: DC

## 2012-05-07 MED ORDER — BOOST / RESOURCE BREEZE PO LIQD: 1.0000 | Freq: Two times a day (BID) | ORAL | Status: DC

## 2012-05-07 MED ORDER — AMLODIPINE BESYLATE 10 MG PO TABS: 10.0000 mg | ORAL_TABLET | Freq: Every day | ORAL | Status: DC

## 2012-05-07 MED ORDER — GUAIFENESIN-DM 100-10 MG/5ML PO SYRP: 5.0000 mL | ORAL_SOLUTION | ORAL | Status: AC | PRN

## 2012-05-07 MED ORDER — FLUCONAZOLE 100 MG PO TABS: 100.0000 mg | ORAL_TABLET | Freq: Every day | ORAL | Status: DC

## 2012-05-07 MED ORDER — SULFAMETHOXAZOLE-TRIMETHOPRIM 400-80 MG PO TABS: 1.0000 | ORAL_TABLET | Freq: Two times a day (BID) | ORAL | Status: DC

## 2012-05-07 MED ORDER — HYDROCODONE-ACETAMINOPHEN 5-325 MG PO TABS
2.0000 | ORAL_TABLET | Freq: Once | ORAL | Status: AC
  Administered 2012-05-07: 2 via ORAL
  Filled 2012-05-07: qty 2

## 2012-05-07 MED ORDER — INSULIN ASPART 100 UNIT/ML ~~LOC~~ SOLN: 3.0000 [IU] | Freq: Three times a day (TID) | SUBCUTANEOUS | Status: DC

## 2012-05-07 NOTE — Discharge Summary (Signed)
Physician Discharge Summary  [name]:[id number] DOB: Apr 14, 1956 DOA: 05/02/2012  PCP: Terald Sleeper, MD  Admit date: 05/02/2012 Discharge date: 05/07/2012  Time spent: 40  minutes  Recommendations for Outpatient Follow-up:  1. Return to [facility]. Please check renal function once weekly for 4 weeks then every 2-3 months thereafter. 2. Follow up with Dr Eliott Nine in 4 weeks  Discharge Diagnoses:  Principal Problem:  *Sepsis, due to UTI  Active Problems:  metabolic Acidosis  Encephalopathy, acute  UTI (lower urinary tract infection)  Acute-on-chronic kidney injury  Acute respiratory failure  Paraplegia  HTN  CAD  Hyperkalemia  Anemia   Discharge Condition: Fair  Diet recommendation: renal  Filed Weights   05/04/12 2242 05/05/12 2124 05/06/12 2100  Weight: 92.761 kg (204 lb 8 oz) 94.802 kg (209 lb) 94.802 kg (209 lb)    History of present illness:   56 yo NH resident with paraplegia found to be lethargic and bradycardic. Intubated for airway protection. Labs showed of 8.3, creatinine of 7.5 and severe metabolic acidosis with sepsis.   Hospital Course:   . Sepsis Likely due to UTI  - Resolved with IV antibiotics  - culture with yeast and diflucan added  - blood cultures negative  - Urine culture Negative but prior urine culture grew out yeast.  UTI  - on primaxin and would treat as complicated UTI. Plan to treat for 10 days ( day 6 ). Will switch to oral single strength bactrim ( given impaired  renal function) on discharge for 10 day course. Also will be discharged on a course of diflucan.  Acute kidney injury  - Baseline reportedly (1.4-1.6). No renal fn available in system since 08/12 . Received CVVHD on admission.  - Nephrology on board and making further recommendations. Renal fn slowly improving and is 2.86 on discharge. - Avoid ACEi or ARB in future - D/C HD catheter.  -check renal function every week for 4 weeks and then every 2-3  months thereafter. F/up with Dr Eliott Nine as outpatient in 4 weeks.  Hypertension  elevated BP noted. Will resume all his BP meds on discharge  . DM  - Placed on SSI  - hgba1c of 5 . i will reduce his lantus to 15 units  daily and reduce the aspart to 3 units tid   . Hypokalemia  Resolved.   Anemia  possibly related to chronic disease. Iron panel and B12 wnl  Code Status: full   Consultants:  Nephrology:   Discharge Exam: Filed Vitals:   05/06/12 1806 05/06/12 2100 05/07/12 0300 05/07/12 0909  BP: 182/74 155/77 100/79 184/80  Pulse:  74 87 63  Temp:  98.4 F (36.9 C) 98 F (36.7 C) 97.8 F (36.6 C)  TempSrc:  Oral Oral   Resp:  18 18 18   Height:      Weight:  94.802 kg (209 lb)    SpO2:  98% 98% 100%    General: Related male in no acute distress  HEENT: No pallor, moist oral mucosa, Cardiovascular: S1 and S2, no murmurs rub or gallop  Respiratory: Clear to auscultation bilaterally  Abdomen: Soft, nontender, nondistended, colostomy bag and suprapubic catheter in place, bowel  sounds present  Extremities: Warm, no edema,  CNS: AAO x3   Discharge Instructions     Medication List     As of 05/07/2012 11:06 AM    STOP taking these medications         INVANZ 1 G injection   Generic drug:  ertapenem      lisinopril 2.5 MG tablet   Commonly known as: PRINIVIL,ZESTRIL      TAKE these medications         amLODipine 10 MG tablet   Commonly known as: NORVASC   Take 10 mg by mouth daily.      aspirin 81 MG tablet   Take 81 mg by mouth daily.      atorvastatin 80 MG tablet   Commonly known as: LIPITOR   Take 80 mg by mouth daily.      carvedilol 12.5 MG tablet   Commonly known as: COREG   Take 12.5 mg by mouth 2 (two) times daily with a meal.      Cholecalciferol 50000 UNITS Tabs   Take 1 tablet by mouth every 30 (thirty) days.      clopidogrel 75 MG tablet   Commonly known as: PLAVIX   Take 75 mg by mouth daily.      diphenhydrAMINE 25 MG tablet    Commonly known as: SOMINEX   Take 25 mg by mouth every 6 (six) hours as needed. itching      docusate sodium 100 MG capsule   Commonly known as: COLACE   Take 100 mg by mouth daily.      feeding supplement Liqd   Take 1 Container by mouth 2 (two) times daily between meals.      fenofibrate 160 MG tablet   Take 160 mg by mouth daily.      fluconazole 100 MG tablet   Commonly known as: DIFLUCAN   Take 1 tablet (100 mg total) by mouth daily.      guaiFENesin-dextromethorphan 100-10 MG/5ML syrup   Commonly known as: ROBITUSSIN DM   Take 5 mLs by mouth every 4 (four) hours as needed for cough.      HYDROcodone-acetaminophen 5-325 MG per tablet   Commonly known as: NORCO/VICODIN   Take 1 tablet by mouth every 6 (six) hours as needed.      insulin aspart 100 UNIT/ML injection   Commonly known as: novoLOG   Inject 3 Units into the skin 3 (three) times daily before meals. Sliding scale      insulin glargine 100 UNIT/ML injection   Commonly known as: LANTUS   Inject 15 Units into the skin at bedtime.      isosorbide mononitrate 30 MG 24 hr tablet   Commonly known as: IMDUR   Take 30 mg by mouth daily.      Melatonin 3 MG Caps   Take 2 capsules by mouth at bedtime.      morphine 30 MG 12 hr tablet   Commonly known as: MS CONTIN   Take 30 mg by mouth every 12 (twelve) hours.      multivitamin capsule   Take 1 capsule by mouth daily.      nitroGLYCERIN 0.4 MG SL tablet   Commonly known as: NITROSTAT   Place 0.4 mg under the tongue every 5 (five) minutes as needed. Chest pain      pantoprazole 40 MG tablet   Commonly known as: PROTONIX   Take 40 mg by mouth 2 (two) times daily.      phenazopyridine 100 MG tablet   Commonly known as: PYRIDIUM   Take 100 mg by mouth 3 (three) times daily as needed. Urinary pain/cramping      promethazine 25 MG tablet   Commonly known as: PHENERGAN   Take 25 mg by mouth every 6 (six) hours  as needed. nausea      saccharomyces boulardii  250 MG capsule   Commonly known as: FLORASTOR   Take 250 mg by mouth 2 (two) times daily.      sodium bicarbonate 650 MG tablet   Take 650 mg by mouth daily.      sodium phosphate 7-19 GM/118ML Enem   Place 1 enema rectally daily as needed. For constipation      sulfamethoxazole-trimethoprim 400-80 MG per tablet   Commonly known as: BACTRIM,SEPTRA   Take 1 tablet by mouth 2 (two) times daily.      SYSTANE BALANCE 0.6 % Soln   Generic drug: Propylene Glycol   Place 1 drop into both eyes 2 (two) times daily.           Follow-up Information    Follow up with Terald Sleeper, MD. (At [facility] needs renal panel checked once weekly for 4 weeks then every 2-3 months)    Contact information:   [address] [place] [address] [phone]       Follow up with DUNHAM,CYNTHIA B, MD. In 4 weeks.   Contact information:   [address] [facility] [place] [address] [phone]           The results of significant diagnostics from this hospitalization (including imaging, microbiology, ancillary and laboratory) are listed below for reference.    Significant Diagnostic Studies: US Renal Port  05/02/2012  *RADIOLOGY REPORT*  Clinical Data: Acute on chronic renal failure  RENAL/URINARY TRACT ULTRASOUND COMPLETE  Comparison:  CT abdomen pelvis dated 08/29/2008  Findings:  Right Kidney:  Measures 11.0 cm.  Poorly visualized.  No mass or hydronephrosis.  Left Kidney:  Measures 13.9 cm.  No mass or hydronephrosis.  Bladder:  Decompressed by indwelling Foley catheter.  Additional comments: Coarse, echogenic hepatic parenchyma, nonspecific.  Possible perihepatic ascites.  IMPRESSION: No hydronephrosis.  Bladder decompressed by indwelling Foley catheter.   Original Report Authenticated By: Charline Bills, M.D.    Dg Chest Port 1 View  05/05/2012  *RADIOLOGY REPORT*  Clinical Data: Cough and congestion  PORTABLE CHEST - 1 VIEW  Comparison: 05/02/2012   Findings: Lung volumes remain low.  There is opacity at the right lung base mostly obscuring the right heart border and right hemidiaphragm.  This is consistent with right lower and middle lobe atelectasis.  Infiltrate is not excluded.  There is some mild discoid atelectasis in the aerated right upper lobe and minor subsegmental atelectasis or scarring at the left lung base.  No pulmonary edema.  No pneumothorax.  Left internal jugular dual lumen central venous catheter has its tip in the mid superior vena cava.  Right internal jugular central venous line has its tip in the superior aspect of the superior vena cava.  The nasogastric tube has been removed as has the endotracheal tube.  IMPRESSION: Right-sided atelectasis involving the right middle and lower lobes. Infiltrate should be considered in the proper clinical setting. This is essentially stable.  No pulmonary edema.  Remaining support apparatus is stable well-positioned.   Original Report Authenticated By: Amie Portland, M.D.    Dg Chest Port 1 View  05/02/2012  *RADIOLOGY REPORT*  Clinical Data: Left IJ placement  PORTABLE CHEST - 1 VIEW  Comparison: 05/02/2012  Findings: Endotracheal tube terminates 2.5 cm above the carina.  Interval placement of a left IJ dual lumen dialysis catheter with its tip in the mid SVC.  No pneumothorax.  Stable right IJ venous catheter.  Enteric tube courses below the diaphragm.  Defibrillator pad overlying the left hemithorax.  Low lung volumes with vascular crowding and bilateral lower lobe atelectasis.  Possible small right pleural effusion.  Stable cardiomegaly.  IMPRESSION: Endotracheal tube terminates 2.5 cm above the carina.  Interval placement of a left IJ dual lumen dialysis catheter with its tip in the mid SVC.  No pneumothorax.  Otherwise unchanged.   Original Report Authenticated By: Charline Bills, M.D.    Dg Chest Port 1 View  05/02/2012  *RADIOLOGY REPORT*  Clinical Data: Central line placement.  PORTABLE  CHEST - 1 VIEW  Comparison: 05/02/2012 at 0143 hours  Findings: Endotracheal and NG tube remain unchanged in position. Interval placement of a right internal jugular venous catheter with tip in the upper SVC region.  No pneumothorax.  Cardiac enlargement with some pulmonary vascular congestion.  Shallow inspiration. Probable atelectasis and effusion in the right base.  IMPRESSION: Right central venous catheter placed with tip over the upper SVC region.  No pneumothorax.  Examination is otherwise unchanged.   Original Report Authenticated By: Burman Nieves, M.D.    Dg Chest Portable 1 View  05/02/2012  *RADIOLOGY REPORT*  Clinical Data: Post intubation  PORTABLE CHEST - 1 VIEW  Comparison: Portable exam  Findings: Tip of endotracheal tube 3.6 cm above carina. Nasogastric tube extends into abdomen. External pacing leads present. Elevation of right diaphragm with right basilar atelectasis and decreased right lung volume. Left lung grossly clear. No pleural effusion or pneumothorax. Bones demineralized. Prior cervical spine fusion.  IMPRESSION: Elevation of right diaphragm and right basilar atelectasis. Satisfactory endotracheal tube position.   Original Report Authenticated By: Ulyses Southward, M.D.    Dg Abd Portable 1v  05/02/2012  *RADIOLOGY REPORT*  Clinical Data: Lower abdominal pain  PORTABLE ABDOMEN - 1 VIEW  Comparison: None.  Findings: No dilated loops of large or small bowel.  There is a moderate size stool ball in the rectum measuring 6.7 x 8.5 cm.  No pathologic calcifications.   There is chronic erosion and dislocation of the hips. Tip of NG tube projects over the GE junction region.  IMPRESSION:  1.  Stool ball the rectum. 2.  No evidence of bowel obstruction. 3.  Chronic dislocation.   Original Report Authenticated By: Genevive Bi, M.D.     Microbiology: Recent Results (from the past 240 hour(s))  CULTURE, BLOOD (ROUTINE X 2)     Status: Normal (Preliminary result)   Collection Time    05/02/12  2:30 AM      Component Value Range Status Comment   Specimen Description BLOOD RIGHT ARM   Final    Special Requests BOTTLES DRAWN AEROBIC AND ANAEROBIC 9CC   Final    Culture  Setup Time 05/02/2012 15:40   Final    Culture     Final    Value:        BLOOD CULTURE RECEIVED NO GROWTH TO DATE CULTURE WILL BE HELD FOR 5 DAYS BEFORE ISSUING A FINAL NEGATIVE REPORT   Report Status PENDING   Incomplete   CULTURE, BLOOD (ROUTINE X 2)     Status: Normal (Preliminary result)   Collection Time   05/02/12  2:55 AM      Component Value Range Status Comment   Specimen Description BLOOD CENTRAL LINE   Final    Special Requests BOTTLES DRAWN AEROBIC AND ANAEROBIC 10CC EACH   Final    Culture  Setup Time 05/02/2012 15:40   Final    Culture  Final    Value:        BLOOD CULTURE RECEIVED NO GROWTH TO DATE CULTURE WILL BE HELD FOR 5 DAYS BEFORE ISSUING A FINAL NEGATIVE REPORT   Report Status PENDING   Incomplete   MRSA PCR SCREENING     Status: Abnormal   Collection Time   05/02/12  5:00 AM      Component Value Range Status Comment   MRSA by PCR POSITIVE (*) NEGATIVE Final   URINE CULTURE     Status: Normal   Collection Time   05/02/12  5:01 AM      Component Value Range Status Comment   Specimen Description URINE, CATHETERIZED   Final    Special Requests Normal   Final    Culture  Setup Time 05/02/2012 14:05   Final    Colony Count >=100,000 COLONIES/ML   Final    Culture YEAST   Final    Report Status 05/03/2012 FINAL   Final   CULTURE, RESPIRATORY     Status: Normal   Collection Time   05/03/12  6:47 AM      Component Value Range Status Comment   Specimen Description TRACHEAL ASPIRATE   Final    Special Requests NONE   Final    Gram Stain     Final    Value: ABUNDANT WBC PRESENT, PREDOMINANTLY PMN     NO SQUAMOUS EPITHELIAL CELLS SEEN     NO ORGANISMS SEEN   Culture FEW CANDIDA ALBICANS   Final    Report Status 05/05/2012 FINAL   Final   URINE CULTURE     Status: Normal    Collection Time   05/04/12  3:51 PM      Component Value Range Status Comment   Specimen Description URINE, CLEAN CATCH   Final    Special Requests NONE   Final    Culture  Setup Time 05/04/2012 17:28   Final    Colony Count NO GROWTH   Final    Culture NO GROWTH   Final    Report Status 05/06/2012 FINAL   Final      Labs: Basic Metabolic Panel:  Lab 05/07/12 2956 05/06/12 0500 05/05/12 0450 05/04/12 0425 05/03/12 0425  NA 142 142 144 140 137  K 3.9 3.6 3.6 3.0* 3.4*  CL 108 105 103 97 92*  CO2 25 26 30  32 31  GLUCOSE 121* 120* 104* 103* 104*  BUN 41* 39* 39* 41* 43*  CREATININE 2.86* 3.06* 3.37* 3.30* 2.86*  CALCIUM 8.6 8.5 8.3* 7.4* 7.7*  MG 1.8 1.9 1.8 1.9 1.9  PHOS 5.1* 4.4 4.9* 5.4* 3.5   Liver Function Tests:  Lab 05/07/12 0530 05/06/12 0500 05/05/12 0450 05/04/12 0425 05/03/12 0425 05/02/12 0334 05/02/12 0146  AST -- -- -- -- -- 22 19  ALT -- -- -- -- -- 8 7  ALKPHOS -- -- -- -- -- 51 50  BILITOT -- -- -- -- -- 0.3 0.2*  PROT -- -- -- -- -- 6.7 6.9  ALBUMIN 2.6* 2.6* 2.6* 2.6* 2.9* -- --   No results found for this basename: LIPASE:5,AMYLASE:5 in the last 168 hours No results found for this basename: AMMONIA:5 in the last 168 hours CBC:  Lab 05/07/12 0530 05/06/12 0500 05/02/12 0900 05/02/12 0209 05/02/12 0204 05/02/12 0146  WBC 7.8 8.3 10.1 -- -- 9.1  NEUTROABS -- -- -- -- -- 6.5  HGB 8.1* 8.0* 8.8* 10.9* 11.2* --  HCT 27.4* 26.2* 28.6* 32.0* 33.0* --  MCV 93.2 91.9 90.2 -- -- 93.0  PLT 354 347 375 -- -- 358   Cardiac Enzymes:  Lab 05/02/12 0414  CKTOTAL --  CKMB --  CKMBINDEX --  TROPONINI <0.30   BNP: BNP (last 3 results)  Basename 05/02/12 0147  PROBNP 2985.0*   CBG:  Lab 05/07/12 0724 05/06/12 2154 05/06/12 1623 05/06/12 1134 05/06/12 0733  GLUCAP 133* 125* 148* 207* 113*       Signed:  Sabine Tenenbaum  [facility] 05/07/2012, 11:06 AM

## 2012-05-07 NOTE — Progress Notes (Signed)
Report called to nurse at [facility], all questions addressed. Pt assessment unchanged from morning. Central line removed. Tele removed. Medications discussed with pt, pt shows no barriers to discharge. Pt awaiting ambulance transport back to [facility].

## 2012-05-07 NOTE — Progress Notes (Signed)
CVC removed per order. No bleeding noted at site. Pt verbalizes via teachback method site care and signs of infection.  Pt a&o x4- going to [facility].

## 2012-05-08 LAB — CULTURE, BLOOD (ROUTINE X 2): Culture: NO GROWTH

## 2012-05-08 NOTE — Clinical Social Work Note (Signed)
Patient admitted to hospital from [facility]. Medically ready for discharge on 05/07/12. CSW forwarded discharge information to facility facilitated patient's transport via ambulance.  Genelle Bal, MSW, LCSW [phone]

## 2012-08-06 ENCOUNTER — Other Ambulatory Visit: Payer: Self-pay | Admitting: Geriatric Medicine

## 2012-08-06 DIAGNOSIS — R52 Pain, unspecified: Secondary | ICD-10-CM

## 2012-08-06 MED ORDER — MORPHINE SULFATE ER 30 MG PO CP24: 30.0000 mg | ORAL_CAPSULE | Freq: Every day | ORAL | Status: DC

## 2012-08-06 MED ORDER — MORPHINE SULFATE ER 30 MG PO TBCR: EXTENDED_RELEASE_TABLET | ORAL | Status: DC

## 2012-08-06 NOTE — Addendum Note (Signed)
Addended by: Conception Chancy R on: 08/06/2012 02:22 PM   Modules accepted: Orders, Medications

## 2012-08-07 ENCOUNTER — Other Ambulatory Visit: Payer: Self-pay | Admitting: *Deleted

## 2012-08-07 DIAGNOSIS — R52 Pain, unspecified: Secondary | ICD-10-CM

## 2012-08-07 MED ORDER — MORPHINE SULFATE ER 30 MG PO CP24: ORAL_CAPSULE | ORAL | Status: DC

## 2012-08-09 ENCOUNTER — Non-Acute Institutional Stay (SKILLED_NURSING_FACILITY): Payer: Medicare Other | Admitting: Internal Medicine

## 2012-08-09 DIAGNOSIS — L89309 Pressure ulcer of unspecified buttock, unspecified stage: Secondary | ICD-10-CM
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
## 2012-08-18 DIAGNOSIS — E1129 Type 2 diabetes mellitus with other diabetic kidney complication: Secondary | ICD-10-CM

## 2012-08-18 DIAGNOSIS — A4902 Methicillin resistant Staphylococcus aureus infection, unspecified site: Secondary | ICD-10-CM

## 2012-08-18 DIAGNOSIS — E1149 Type 2 diabetes mellitus with other diabetic neurological complication: Secondary | ICD-10-CM

## 2012-08-18 DIAGNOSIS — I1310 Hypertensive heart and chronic kidney disease without heart failure, with stage 1 through stage 4 chronic kidney disease, or unspecified chronic kidney disease: Secondary | ICD-10-CM

## 2012-08-25 ENCOUNTER — Non-Acute Institutional Stay (SKILLED_NURSING_FACILITY): Payer: Medicare Other | Admitting: Internal Medicine

## 2012-08-25 DIAGNOSIS — G909 Disorder of the autonomic nervous system, unspecified: Secondary | ICD-10-CM

## 2012-08-25 DIAGNOSIS — E1143 Type 2 diabetes mellitus with diabetic autonomic (poly)neuropathy: Secondary | ICD-10-CM

## 2012-08-25 DIAGNOSIS — E1129 Type 2 diabetes mellitus with other diabetic kidney complication: Secondary | ICD-10-CM

## 2012-08-25 DIAGNOSIS — E1149 Type 2 diabetes mellitus with other diabetic neurological complication: Secondary | ICD-10-CM

## 2012-08-25 DIAGNOSIS — L89309 Pressure ulcer of unspecified buttock, unspecified stage: Secondary | ICD-10-CM
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
## 2012-09-02 ENCOUNTER — Other Ambulatory Visit: Payer: Self-pay | Admitting: Geriatric Medicine

## 2012-09-02 MED ORDER — HYDROCODONE-ACETAMINOPHEN 5-325 MG PO TABS: 1.0000 | ORAL_TABLET | Freq: Four times a day (QID) | ORAL | Status: DC | PRN

## 2012-09-08 ENCOUNTER — Non-Acute Institutional Stay (SKILLED_NURSING_FACILITY): Payer: Medicare Other | Admitting: Internal Medicine

## 2012-09-08 DIAGNOSIS — L89309 Pressure ulcer of unspecified buttock, unspecified stage: Secondary | ICD-10-CM
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
## 2012-09-24 ENCOUNTER — Other Ambulatory Visit: Payer: Self-pay | Admitting: Geriatric Medicine

## 2012-09-24 DIAGNOSIS — R52 Pain, unspecified: Secondary | ICD-10-CM

## 2012-09-24 MED ORDER — MORPHINE SULFATE ER 30 MG PO CP24: ORAL_CAPSULE | ORAL | Status: DC

## 2012-09-30 ENCOUNTER — Other Ambulatory Visit: Payer: Self-pay | Admitting: Geriatric Medicine

## 2012-09-30 DIAGNOSIS — R52 Pain, unspecified: Secondary | ICD-10-CM

## 2012-09-30 MED ORDER — MORPHINE SULFATE ER 30 MG PO CP24: ORAL_CAPSULE | ORAL | Status: DC

## 2012-10-01 ENCOUNTER — Other Ambulatory Visit: Payer: Self-pay | Admitting: Geriatric Medicine

## 2012-10-01 MED ORDER — MORPHINE SULFATE 30 MG PO TABS: ORAL_TABLET | ORAL | Status: DC

## 2012-10-02 ENCOUNTER — Other Ambulatory Visit: Payer: Self-pay | Admitting: *Deleted

## 2012-10-02 MED ORDER — MORPHINE SULFATE ER 30 MG PO TBCR: EXTENDED_RELEASE_TABLET | ORAL | Status: DC

## 2012-10-06 DIAGNOSIS — E1149 Type 2 diabetes mellitus with other diabetic neurological complication: Secondary | ICD-10-CM

## 2012-10-06 DIAGNOSIS — K219 Gastro-esophageal reflux disease without esophagitis: Secondary | ICD-10-CM

## 2012-10-06 DIAGNOSIS — K3184 Gastroparesis: Secondary | ICD-10-CM

## 2012-10-11 ENCOUNTER — Other Ambulatory Visit: Payer: Self-pay

## 2012-10-12 ENCOUNTER — Encounter (HOSPITAL_COMMUNITY): Payer: Self-pay | Admitting: Family Medicine

## 2012-10-12 ENCOUNTER — Emergency Department (HOSPITAL_COMMUNITY)
Admission: EM | Admit: 2012-10-12 | Discharge: 2012-10-12 | Disposition: A | Discharge status: Home / Self Care | Payer: Medicare Other | Attending: Emergency Medicine | Admitting: Emergency Medicine

## 2012-10-12 DIAGNOSIS — R899 Unspecified abnormal finding in specimens from other organs, systems and tissues: Secondary | ICD-10-CM

## 2012-10-12 DIAGNOSIS — Z794 Long term (current) use of insulin: Secondary | ICD-10-CM | POA: Insufficient documentation

## 2012-10-12 DIAGNOSIS — Z7982 Long term (current) use of aspirin: Secondary | ICD-10-CM | POA: Insufficient documentation

## 2012-10-12 DIAGNOSIS — Z8669 Personal history of other diseases of the nervous system and sense organs: Secondary | ICD-10-CM | POA: Insufficient documentation

## 2012-10-12 DIAGNOSIS — Z8614 Personal history of Methicillin resistant Staphylococcus aureus infection: Secondary | ICD-10-CM | POA: Insufficient documentation

## 2012-10-12 DIAGNOSIS — F411 Generalized anxiety disorder: Secondary | ICD-10-CM | POA: Insufficient documentation

## 2012-10-12 DIAGNOSIS — Z7902 Long term (current) use of antithrombotics/antiplatelets: Secondary | ICD-10-CM | POA: Insufficient documentation

## 2012-10-12 DIAGNOSIS — Z8639 Personal history of other endocrine, nutritional and metabolic disease: Secondary | ICD-10-CM | POA: Insufficient documentation

## 2012-10-12 DIAGNOSIS — Z79899 Other long term (current) drug therapy: Secondary | ICD-10-CM | POA: Insufficient documentation

## 2012-10-12 DIAGNOSIS — F3289 Other specified depressive episodes: Secondary | ICD-10-CM | POA: Insufficient documentation

## 2012-10-12 DIAGNOSIS — F329 Major depressive disorder, single episode, unspecified: Secondary | ICD-10-CM | POA: Insufficient documentation

## 2012-10-12 DIAGNOSIS — K219 Gastro-esophageal reflux disease without esophagitis: Secondary | ICD-10-CM | POA: Insufficient documentation

## 2012-10-12 DIAGNOSIS — N189 Chronic kidney disease, unspecified: Secondary | ICD-10-CM | POA: Insufficient documentation

## 2012-10-12 DIAGNOSIS — Z862 Personal history of diseases of the blood and blood-forming organs and certain disorders involving the immune mechanism: Secondary | ICD-10-CM | POA: Insufficient documentation

## 2012-10-12 DIAGNOSIS — E785 Hyperlipidemia, unspecified: Secondary | ICD-10-CM | POA: Insufficient documentation

## 2012-10-12 DIAGNOSIS — R7982 Elevated C-reactive protein (CRP): Secondary | ICD-10-CM | POA: Insufficient documentation

## 2012-10-12 DIAGNOSIS — I252 Old myocardial infarction: Secondary | ICD-10-CM | POA: Insufficient documentation

## 2012-10-12 DIAGNOSIS — I2510 Atherosclerotic heart disease of native coronary artery without angina pectoris: Secondary | ICD-10-CM | POA: Insufficient documentation

## 2012-10-12 DIAGNOSIS — E119 Type 2 diabetes mellitus without complications: Secondary | ICD-10-CM | POA: Insufficient documentation

## 2012-10-12 DIAGNOSIS — Z8719 Personal history of other diseases of the digestive system: Secondary | ICD-10-CM | POA: Insufficient documentation

## 2012-10-12 DIAGNOSIS — I129 Hypertensive chronic kidney disease with stage 1 through stage 4 chronic kidney disease, or unspecified chronic kidney disease: Secondary | ICD-10-CM | POA: Insufficient documentation

## 2012-10-12 LAB — BASIC METABOLIC PANEL
BUN: 53 mg/dL — ABNORMAL HIGH (ref 6–23)
Chloride: 103 mEq/L (ref 96–112)
Creatinine, Ser: 3.32 mg/dL — ABNORMAL HIGH (ref 0.50–1.35)
GFR calc Af Amer: 23 mL/min — ABNORMAL LOW (ref >90)

## 2012-10-12 NOTE — ED Notes (Signed)
Pt from [facility] nursing [facility]. Pt sent here for abnormal lab values. Potassium 7.2, BUN 44, Cr 3.56

## 2012-10-12 NOTE — ED Notes (Signed)
Pt comfortable with d/c and f/u instructions. No prescriptions. Pt awaiting PTAR at this time.

## 2012-10-12 NOTE — ED Notes (Signed)
Pt denies pain, fever/chills, SOB, CP, or any other complaints.
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
## 2012-10-12 NOTE — ED Notes (Signed)
PTAR paged. Report called to [facility].

## 2012-10-26 DIAGNOSIS — E1129 Type 2 diabetes mellitus with other diabetic kidney complication: Secondary | ICD-10-CM

## 2012-10-26 DIAGNOSIS — I1310 Hypertensive heart and chronic kidney disease without heart failure, with stage 1 through stage 4 chronic kidney disease, or unspecified chronic kidney disease: Secondary | ICD-10-CM

## 2012-10-26 DIAGNOSIS — N184 Chronic kidney disease, stage 4 (severe): Secondary | ICD-10-CM

## 2012-10-26 DIAGNOSIS — Z936 Other artificial openings of urinary tract status: Secondary | ICD-10-CM

## 2012-11-04 ENCOUNTER — Other Ambulatory Visit: Payer: Self-pay | Admitting: Geriatric Medicine

## 2012-11-04 MED ORDER — HYDROCODONE-ACETAMINOPHEN 5-325 MG PO TABS: 1.0000 | ORAL_TABLET | Freq: Four times a day (QID) | ORAL | Status: DC | PRN

## 2012-12-01 ENCOUNTER — Non-Acute Institutional Stay (SKILLED_NURSING_FACILITY): Payer: Medicare Other | Admitting: Internal Medicine

## 2012-12-01 DIAGNOSIS — E1165 Type 2 diabetes mellitus with hyperglycemia: Secondary | ICD-10-CM

## 2012-12-01 DIAGNOSIS — E1143 Type 2 diabetes mellitus with diabetic autonomic (poly)neuropathy: Secondary | ICD-10-CM

## 2012-12-01 DIAGNOSIS — E1149 Type 2 diabetes mellitus with other diabetic neurological complication: Secondary | ICD-10-CM

## 2012-12-01 DIAGNOSIS — G909 Disorder of the autonomic nervous system, unspecified: Secondary | ICD-10-CM
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
## 2012-12-08 ENCOUNTER — Non-Acute Institutional Stay (SKILLED_NURSING_FACILITY): Payer: Medicare Other | Admitting: Internal Medicine

## 2012-12-08 ENCOUNTER — Other Ambulatory Visit: Payer: Self-pay | Admitting: Geriatric Medicine

## 2012-12-08 DIAGNOSIS — N179 Acute kidney failure, unspecified: Secondary | ICD-10-CM

## 2012-12-08 DIAGNOSIS — E1165 Type 2 diabetes mellitus with hyperglycemia: Secondary | ICD-10-CM

## 2012-12-08 NOTE — Progress Notes (Signed)
Patient ID: [name], male   DOB: 10/16/1955, 57 y.o.   MRN: [id number] Facility; [facility] Chief complaint followup acute renal failure. History; [name] is a gentleman who is lab work on last week. His BUN came back at 64, creatinine 4.2, weight, potassium of 5.5. His CBC showed a hemoglobin of 8.6. Total CK of 40. The patient has a history of chronic renal failure likely related to diabetes and hypertension. He was in the hospital late in 2013, related to ACE inhibitors, and sepsis. It is very clear that his creatinine has been deteriorating over the course of this year. This was mostly in the low 2 range in 2013, although as usual. The chart has been thinned. His creatinine in early January 2014 was 2.8. Because of the most recent deterioration is not totally clear. On March 2014. His creatinine was 2.9 on May 26. It was 3.56 and then on July 16 at 4.48 as mentioned. He generally otherwise is asymptomatic. He has a good urine output through his suprapubic bag, this does not appear concentrated.  Physical exam; Cardiac heart sounds are normal. He does not appear to be dehydrated. Abdomen no tenderness or masses. GU as mentioned. He has good. The Foley catheter. Urine output.  Impression/plan Acute on chronic renal failure. The cause of this is not totally clear. There are and not any usual medication offenders. This does not appear to be pre-or post renal. It may be that he is simply entering a more aggressive downward spiral of his chronic renal failure. At this rate. He would be dialysis requiring possibly before the end of the year. Towards that end, I have had discussions with him. He states that he will refuse dialysis, although I am not completely sure The reasons he gives are all sound EXCEPT for the fact that he does not want to prolong his life, which is I guess the only reason that anybody needs to hear. He is to have a renal ultrasound, which seems reasonable. As mentioned, total  CK was negative. Fortunately for the moment. He feels well. I discussed him being compliant with the Kayexalate and he has agreed

## 2013-01-04 ENCOUNTER — Other Ambulatory Visit: Payer: Self-pay | Admitting: Geriatric Medicine

## 2013-01-04 MED ORDER — MORPHINE SULFATE ER 15 MG PO TBCR: EXTENDED_RELEASE_TABLET | ORAL | Status: DC

## 2013-01-16 ENCOUNTER — Non-Acute Institutional Stay (SKILLED_NURSING_FACILITY): Payer: Medicare Other | Admitting: Internal Medicine

## 2013-01-16 DIAGNOSIS — E1143 Type 2 diabetes mellitus with diabetic autonomic (poly)neuropathy: Secondary | ICD-10-CM

## 2013-01-16 DIAGNOSIS — E1149 Type 2 diabetes mellitus with other diabetic neurological complication: Secondary | ICD-10-CM

## 2013-01-16 DIAGNOSIS — G909 Disorder of the autonomic nervous system, unspecified: Secondary | ICD-10-CM

## 2013-01-16 DIAGNOSIS — E1129 Type 2 diabetes mellitus with other diabetic kidney complication: Secondary | ICD-10-CM
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
## 2013-02-08 ENCOUNTER — Other Ambulatory Visit: Payer: Self-pay | Admitting: *Deleted

## 2013-02-08 MED ORDER — MORPHINE SULFATE ER 15 MG PO TBCR: EXTENDED_RELEASE_TABLET | ORAL | Status: DC

## 2013-02-09 ENCOUNTER — Non-Acute Institutional Stay (SKILLED_NURSING_FACILITY): Payer: Medicare Other | Admitting: Internal Medicine

## 2013-02-09 DIAGNOSIS — E1149 Type 2 diabetes mellitus with other diabetic neurological complication: Secondary | ICD-10-CM

## 2013-02-09 DIAGNOSIS — G909 Disorder of the autonomic nervous system, unspecified: Secondary | ICD-10-CM

## 2013-02-09 DIAGNOSIS — E1143 Type 2 diabetes mellitus with diabetic autonomic (poly)neuropathy: Secondary | ICD-10-CM

## 2013-02-09 DIAGNOSIS — E1129 Type 2 diabetes mellitus with other diabetic kidney complication: Secondary | ICD-10-CM
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
## 2013-03-02 ENCOUNTER — Non-Acute Institutional Stay (SKILLED_NURSING_FACILITY): Payer: Medicare Other | Admitting: Internal Medicine

## 2013-03-02 DIAGNOSIS — E1129 Type 2 diabetes mellitus with other diabetic kidney complication: Secondary | ICD-10-CM

## 2013-03-02 DIAGNOSIS — N184 Chronic kidney disease, stage 4 (severe): Secondary | ICD-10-CM
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
## 2013-03-09 ENCOUNTER — Other Ambulatory Visit: Payer: Self-pay | Admitting: *Deleted

## 2013-03-09 MED ORDER — MORPHINE SULFATE ER 15 MG PO TBCR: EXTENDED_RELEASE_TABLET | ORAL | Status: DC

## 2013-04-05 ENCOUNTER — Other Ambulatory Visit: Payer: Self-pay | Admitting: *Deleted

## 2013-04-05 MED ORDER — MORPHINE SULFATE ER 15 MG PO TBCR: EXTENDED_RELEASE_TABLET | ORAL | Status: DC

## 2013-04-08 ENCOUNTER — Other Ambulatory Visit: Payer: Self-pay

## 2013-04-08 MED ORDER — MORPHINE SULFATE ER 15 MG PO TBCR: EXTENDED_RELEASE_TABLET | ORAL | Status: DC

## 2013-04-13 ENCOUNTER — Non-Acute Institutional Stay (SKILLED_NURSING_FACILITY): Payer: Medicare Other | Admitting: Internal Medicine

## 2013-04-13 DIAGNOSIS — E1129 Type 2 diabetes mellitus with other diabetic kidney complication: Secondary | ICD-10-CM

## 2013-04-13 DIAGNOSIS — N184 Chronic kidney disease, stage 4 (severe): Secondary | ICD-10-CM
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
## 2013-04-26 ENCOUNTER — Other Ambulatory Visit: Payer: Self-pay

## 2013-04-26 MED ORDER — HYDROCODONE-ACETAMINOPHEN 5-325 MG PO TABS: ORAL_TABLET | ORAL | Status: DC

## 2013-04-27 ENCOUNTER — Non-Acute Institutional Stay (SKILLED_NURSING_FACILITY): Payer: Medicare Other | Admitting: Internal Medicine

## 2013-04-27 DIAGNOSIS — N184 Chronic kidney disease, stage 4 (severe): Secondary | ICD-10-CM

## 2013-04-27 DIAGNOSIS — G909 Disorder of the autonomic nervous system, unspecified: Secondary | ICD-10-CM

## 2013-04-27 DIAGNOSIS — E1143 Type 2 diabetes mellitus with diabetic autonomic (poly)neuropathy: Secondary | ICD-10-CM

## 2013-04-27 DIAGNOSIS — E1129 Type 2 diabetes mellitus with other diabetic kidney complication: Secondary | ICD-10-CM

## 2013-04-27 DIAGNOSIS — E1149 Type 2 diabetes mellitus with other diabetic neurological complication: Secondary | ICD-10-CM
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
## 2013-05-17 ENCOUNTER — Other Ambulatory Visit: Payer: Self-pay | Admitting: *Deleted

## 2013-05-17 MED ORDER — MORPHINE SULFATE ER 15 MG PO TBCR: EXTENDED_RELEASE_TABLET | ORAL | Status: DC

## 2013-05-25 ENCOUNTER — Other Ambulatory Visit: Payer: Self-pay | Admitting: *Deleted

## 2013-05-25 MED ORDER — HYDROCODONE-ACETAMINOPHEN 5-325 MG PO TABS: ORAL_TABLET | ORAL | Status: DC

## 2013-06-14 ENCOUNTER — Other Ambulatory Visit: Payer: Self-pay | Admitting: *Deleted

## 2013-06-14 MED ORDER — MORPHINE SULFATE ER 15 MG PO TBCR: EXTENDED_RELEASE_TABLET | ORAL | Status: DC

## 2013-06-15 ENCOUNTER — Non-Acute Institutional Stay (SKILLED_NURSING_FACILITY): Payer: Medicare Other | Admitting: Internal Medicine

## 2013-06-15 DIAGNOSIS — G909 Disorder of the autonomic nervous system, unspecified: Secondary | ICD-10-CM

## 2013-06-15 DIAGNOSIS — E1143 Type 2 diabetes mellitus with diabetic autonomic (poly)neuropathy: Secondary | ICD-10-CM

## 2013-06-15 DIAGNOSIS — N184 Chronic kidney disease, stage 4 (severe): Secondary | ICD-10-CM

## 2013-06-15 DIAGNOSIS — E1149 Type 2 diabetes mellitus with other diabetic neurological complication: Secondary | ICD-10-CM
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
## 2013-06-24 ENCOUNTER — Other Ambulatory Visit: Payer: Self-pay | Admitting: *Deleted

## 2013-06-24 MED ORDER — HYDROCODONE-ACETAMINOPHEN 5-325 MG PO TABS: ORAL_TABLET | ORAL | Status: DC

## 2013-06-24 NOTE — Telephone Encounter (Signed)
[facility] 

## 2013-07-06 ENCOUNTER — Non-Acute Institutional Stay (SKILLED_NURSING_FACILITY): Payer: Medicare Other | Admitting: Internal Medicine

## 2013-07-06 DIAGNOSIS — E1149 Type 2 diabetes mellitus with other diabetic neurological complication: Secondary | ICD-10-CM

## 2013-07-06 DIAGNOSIS — E1143 Type 2 diabetes mellitus with diabetic autonomic (poly)neuropathy: Secondary | ICD-10-CM

## 2013-07-06 DIAGNOSIS — N184 Chronic kidney disease, stage 4 (severe): Secondary | ICD-10-CM

## 2013-07-06 DIAGNOSIS — G909 Disorder of the autonomic nervous system, unspecified: Secondary | ICD-10-CM
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
## 2013-07-13 ENCOUNTER — Other Ambulatory Visit: Payer: Self-pay | Admitting: *Deleted

## 2013-07-13 MED ORDER — MORPHINE SULFATE ER 15 MG PO TBCR: EXTENDED_RELEASE_TABLET | ORAL | Status: DC

## 2013-07-13 NOTE — Telephone Encounter (Signed)
[facility] 

## 2013-07-21 ENCOUNTER — Ambulatory Visit (HOSPITAL_COMMUNITY)
Admission: RE | Admit: 2013-07-21 | Discharge: 2013-07-21 | Disposition: A | Discharge status: Home / Self Care | Payer: Medicare Other | Source: Ambulatory Visit | Attending: Internal Medicine | Admitting: Internal Medicine

## 2013-07-21 DIAGNOSIS — D649 Anemia, unspecified: Secondary | ICD-10-CM | POA: Insufficient documentation

## 2013-07-22 ENCOUNTER — Ambulatory Visit (HOSPITAL_COMMUNITY)
Admission: RE | Admit: 2013-07-22 | Discharge: 2013-07-22 | Disposition: A | Discharge status: Home / Self Care | Payer: Medicare Other | Source: Ambulatory Visit | Attending: Internal Medicine | Admitting: Internal Medicine

## 2013-07-22 ENCOUNTER — Encounter (HOSPITAL_COMMUNITY): Payer: Self-pay

## 2013-07-22 MED ORDER — FUROSEMIDE 10 MG/ML IJ SOLN
20.0000 mg | Freq: Once | INTRAMUSCULAR | Status: AC
  Administered 2013-07-22: 20 mg via INTRAVENOUS
  Filled 2013-07-22: qty 2

## 2013-07-22 MED ORDER — SODIUM CHLORIDE 0.9 % IV SOLN
INTRAVENOUS | Status: DC
  Administered 2013-07-22: 10:00:00 via INTRAVENOUS

## 2013-07-22 NOTE — Discharge Instructions (Signed)
Blood Transfusion  A blood transfusion replaces your blood or some of its parts. Blood is replaced when you have lost blood because of surgery, an accident, or for severe blood conditions like anemia. You can donate blood to be used on yourself if you have a planned surgery. If you lose blood during that surgery, your own blood can be given back to you. Any blood given to you is checked to make sure it matches your blood type. Your temperature, blood pressure, and heart rate (vital signs) will be checked often.  GET HELP RIGHT AWAY IF:   You feel sick to your stomach (nauseous) or throw up (vomit).  You have watery poop (diarrhea).  You have shortness of breath or trouble breathing.  You have blood in your pee (urine) or have dark colored pee.  You have chest pain or tightness.  Your eyes or skin turn yellow (jaundice).  You have a temperature by mouth above 102 F (38.9 C), not controlled by medicine.  You start to shake and have chills.  You develop a a red rash (hives) or feel itchy.  You develop lightheadedness or feel confused.  You develop back, joint, or muscle pain.  You do not feel hungry (lost appetite).  You feel tired, restless, or nervous.  You develop belly (abdominal) cramps. Document Released: 08/02/2008 Document Revised: 07/29/2011 Document Reviewed: 08/02/2008 [employer] Patient Information 2014 [employer].  

## 2013-07-22 NOTE — Progress Notes (Signed)
PTA staff took CBG upon transport-[id number]

## 2013-07-23 ENCOUNTER — Other Ambulatory Visit: Payer: Self-pay | Admitting: *Deleted

## 2013-07-23 LAB — TYPE AND SCREEN
ABO/RH(D): O NEG
Antibody Screen: NEGATIVE
UNIT DIVISION: 0
Unit division: 0

## 2013-07-23 MED ORDER — HYDROCODONE-ACETAMINOPHEN 5-325 MG PO TABS: ORAL_TABLET | ORAL | Status: DC

## 2013-07-23 NOTE — Telephone Encounter (Signed)
[facility] 

## 2013-07-26 ENCOUNTER — Encounter (HOSPITAL_COMMUNITY): Payer: Self-pay | Admitting: Emergency Medicine

## 2013-07-26 ENCOUNTER — Inpatient Hospital Stay (HOSPITAL_COMMUNITY)
Admission: EM | Admit: 2013-07-26 | Discharge: 2013-07-30 | DRG: 193 | Disposition: A | Discharge status: Skilled Nursing Facility | Payer: Medicare Other | Attending: Internal Medicine | Admitting: Internal Medicine

## 2013-07-26 ENCOUNTER — Emergency Department (HOSPITAL_COMMUNITY): Payer: Medicare Other

## 2013-07-26 ENCOUNTER — Other Ambulatory Visit: Payer: Self-pay

## 2013-07-26 DIAGNOSIS — D631 Anemia in chronic kidney disease: Secondary | ICD-10-CM | POA: Diagnosis present

## 2013-07-26 DIAGNOSIS — Q393 Congenital stenosis and stricture of esophagus: Secondary | ICD-10-CM

## 2013-07-26 DIAGNOSIS — I10 Essential (primary) hypertension: Secondary | ICD-10-CM | POA: Diagnosis present

## 2013-07-26 DIAGNOSIS — E440 Moderate protein-calorie malnutrition: Secondary | ICD-10-CM | POA: Diagnosis present

## 2013-07-26 DIAGNOSIS — E785 Hyperlipidemia, unspecified: Secondary | ICD-10-CM | POA: Diagnosis present

## 2013-07-26 DIAGNOSIS — N039 Chronic nephritic syndrome with unspecified morphologic changes: Secondary | ICD-10-CM

## 2013-07-26 DIAGNOSIS — Z79899 Other long term (current) drug therapy: Secondary | ICD-10-CM

## 2013-07-26 DIAGNOSIS — J189 Pneumonia, unspecified organism: Principal | ICD-10-CM | POA: Diagnosis present

## 2013-07-26 DIAGNOSIS — Z936 Other artificial openings of urinary tract status: Secondary | ICD-10-CM | POA: Diagnosis not present

## 2013-07-26 DIAGNOSIS — Z8614 Personal history of Methicillin resistant Staphylococcus aureus infection: Secondary | ICD-10-CM

## 2013-07-26 DIAGNOSIS — J9600 Acute respiratory failure, unspecified whether with hypoxia or hypercapnia: Secondary | ICD-10-CM | POA: Diagnosis present

## 2013-07-26 DIAGNOSIS — N184 Chronic kidney disease, stage 4 (severe): Secondary | ICD-10-CM | POA: Diagnosis present

## 2013-07-26 DIAGNOSIS — Z888 Allergy status to other drugs, medicaments and biological substances status: Secondary | ICD-10-CM

## 2013-07-26 DIAGNOSIS — J9601 Acute respiratory failure with hypoxia: Secondary | ICD-10-CM | POA: Diagnosis present

## 2013-07-26 DIAGNOSIS — M25519 Pain in unspecified shoulder: Secondary | ICD-10-CM | POA: Diagnosis present

## 2013-07-26 DIAGNOSIS — R531 Weakness: Secondary | ICD-10-CM

## 2013-07-26 DIAGNOSIS — Z6827 Body mass index (BMI) 27.0-27.9, adult: Secondary | ICD-10-CM | POA: Diagnosis not present

## 2013-07-26 DIAGNOSIS — M109 Gout, unspecified: Secondary | ICD-10-CM | POA: Diagnosis present

## 2013-07-26 DIAGNOSIS — Z7982 Long term (current) use of aspirin: Secondary | ICD-10-CM

## 2013-07-26 DIAGNOSIS — I129 Hypertensive chronic kidney disease with stage 1 through stage 4 chronic kidney disease, or unspecified chronic kidney disease: Secondary | ICD-10-CM | POA: Diagnosis present

## 2013-07-26 DIAGNOSIS — M62838 Other muscle spasm: Secondary | ICD-10-CM | POA: Diagnosis present

## 2013-07-26 DIAGNOSIS — Z794 Long term (current) use of insulin: Secondary | ICD-10-CM | POA: Diagnosis not present

## 2013-07-26 DIAGNOSIS — Z791 Long term (current) use of non-steroidal anti-inflammatories (NSAID): Secondary | ICD-10-CM | POA: Diagnosis not present

## 2013-07-26 DIAGNOSIS — Z7902 Long term (current) use of antithrombotics/antiplatelets: Secondary | ICD-10-CM | POA: Diagnosis not present

## 2013-07-26 DIAGNOSIS — R509 Fever, unspecified: Secondary | ICD-10-CM | POA: Diagnosis present

## 2013-07-26 DIAGNOSIS — Q391 Atresia of esophagus with tracheo-esophageal fistula: Secondary | ICD-10-CM

## 2013-07-26 DIAGNOSIS — K219 Gastro-esophageal reflux disease without esophagitis: Secondary | ICD-10-CM | POA: Diagnosis present

## 2013-07-26 DIAGNOSIS — Z833 Family history of diabetes mellitus: Secondary | ICD-10-CM

## 2013-07-26 DIAGNOSIS — F411 Generalized anxiety disorder: Secondary | ICD-10-CM | POA: Diagnosis present

## 2013-07-26 DIAGNOSIS — N179 Acute kidney failure, unspecified: Secondary | ICD-10-CM | POA: Diagnosis present

## 2013-07-26 DIAGNOSIS — E119 Type 2 diabetes mellitus without complications: Secondary | ICD-10-CM | POA: Diagnosis present

## 2013-07-26 DIAGNOSIS — Z951 Presence of aortocoronary bypass graft: Secondary | ICD-10-CM

## 2013-07-26 DIAGNOSIS — R532 Functional quadriplegia: Secondary | ICD-10-CM | POA: Diagnosis present

## 2013-07-26 DIAGNOSIS — D638 Anemia in other chronic diseases classified elsewhere: Secondary | ICD-10-CM | POA: Diagnosis present

## 2013-07-26 DIAGNOSIS — Z881 Allergy status to other antibiotic agents status: Secondary | ICD-10-CM

## 2013-07-26 DIAGNOSIS — G894 Chronic pain syndrome: Secondary | ICD-10-CM | POA: Diagnosis present

## 2013-07-26 DIAGNOSIS — I2510 Atherosclerotic heart disease of native coronary artery without angina pectoris: Secondary | ICD-10-CM | POA: Diagnosis present

## 2013-07-26 DIAGNOSIS — E875 Hyperkalemia: Secondary | ICD-10-CM | POA: Diagnosis present

## 2013-07-26 DIAGNOSIS — R0989 Other specified symptoms and signs involving the circulatory and respiratory systems: Secondary | ICD-10-CM | POA: Diagnosis present

## 2013-07-26 DIAGNOSIS — Z7401 Bed confinement status: Secondary | ICD-10-CM

## 2013-07-26 DIAGNOSIS — L89899 Pressure ulcer of other site, unspecified stage: Secondary | ICD-10-CM | POA: Diagnosis present

## 2013-07-26 DIAGNOSIS — L8992 Pressure ulcer of unspecified site, stage 2: Secondary | ICD-10-CM | POA: Diagnosis present

## 2013-07-26 DIAGNOSIS — G8220 Paraplegia, unspecified: Secondary | ICD-10-CM | POA: Diagnosis present

## 2013-07-26 DIAGNOSIS — Z933 Colostomy status: Secondary | ICD-10-CM

## 2013-07-26 DIAGNOSIS — Z91041 Radiographic dye allergy status: Secondary | ICD-10-CM

## 2013-07-26 DIAGNOSIS — R627 Adult failure to thrive: Secondary | ICD-10-CM | POA: Diagnosis present

## 2013-07-26 DIAGNOSIS — F3289 Other specified depressive episodes: Secondary | ICD-10-CM | POA: Diagnosis present

## 2013-07-26 DIAGNOSIS — I252 Old myocardial infarction: Secondary | ICD-10-CM | POA: Diagnosis not present

## 2013-07-26 DIAGNOSIS — N390 Urinary tract infection, site not specified: Secondary | ICD-10-CM | POA: Diagnosis present

## 2013-07-26 DIAGNOSIS — F329 Major depressive disorder, single episode, unspecified: Secondary | ICD-10-CM | POA: Diagnosis present

## 2013-07-26 DIAGNOSIS — E860 Dehydration: Secondary | ICD-10-CM | POA: Diagnosis present

## 2013-07-26 DIAGNOSIS — D72829 Elevated white blood cell count, unspecified: Secondary | ICD-10-CM | POA: Diagnosis present

## 2013-07-26 DIAGNOSIS — Z515 Encounter for palliative care: Secondary | ICD-10-CM

## 2013-07-26 LAB — CBC WITH DIFFERENTIAL/PLATELET
BASOS ABS: 0.1 10*3/uL (ref 0.0–0.1)
Basophils Relative: 1 % (ref 0–1)
EOS PCT: 2 % (ref 0–5)
Eosinophils Absolute: 0.2 10*3/uL (ref 0.0–0.7)
HEMATOCRIT: 27.2 % — AB (ref 39.0–52.0)
Hemoglobin: 8.9 g/dL — ABNORMAL LOW (ref 13.0–17.0)
LYMPHS ABS: 1.7 10*3/uL (ref 0.7–4.0)
LYMPHS PCT: 14 % (ref 12–46)
MCH: 27.9 pg (ref 26.0–34.0)
MCHC: 32.7 g/dL (ref 30.0–36.0)
MCV: 85.3 fL (ref 78.0–100.0)
Monocytes Absolute: 0.5 10*3/uL (ref 0.1–1.0)
Monocytes Relative: 4 % (ref 3–12)
NEUTROS ABS: 9.7 10*3/uL — AB (ref 1.7–7.7)
Neutrophils Relative %: 80 % — ABNORMAL HIGH (ref 43–77)
Platelets: 469 10*3/uL — ABNORMAL HIGH (ref 150–400)
RBC: 3.19 MIL/uL — AB (ref 4.22–5.81)
RDW: 16.1 % — AB (ref 11.5–15.5)
WBC: 12.2 10*3/uL — AB (ref 4.0–10.5)

## 2013-07-26 LAB — COMPREHENSIVE METABOLIC PANEL
ALBUMIN: 3.5 g/dL (ref 3.5–5.2)
ALT: 11 U/L (ref 0–53)
AST: 15 U/L (ref 0–37)
Alkaline Phosphatase: 117 U/L (ref 39–117)
BILIRUBIN TOTAL: 0.5 mg/dL (ref 0.3–1.2)
BUN: 90 mg/dL — ABNORMAL HIGH (ref 6–23)
CHLORIDE: 96 meq/L (ref 96–112)
CO2: 18 meq/L — AB (ref 19–32)
Calcium: 8.1 mg/dL — ABNORMAL LOW (ref 8.4–10.5)
Creatinine, Ser: 7.94 mg/dL — ABNORMAL HIGH (ref 0.50–1.35)
GFR calc Af Amer: 8 mL/min — ABNORMAL LOW (ref >90)
GFR calc non Af Amer: 7 mL/min — ABNORMAL LOW (ref >90)
Glucose, Bld: 101 mg/dL — ABNORMAL HIGH (ref 70–99)
POTASSIUM: 6 meq/L — AB (ref 3.7–5.3)
Sodium: 139 mEq/L (ref 137–147)
Total Protein: 7.9 g/dL (ref 6.0–8.3)

## 2013-07-26 LAB — URINE MICROSCOPIC-ADD ON

## 2013-07-26 LAB — BASIC METABOLIC PANEL
BUN: 93 mg/dL — ABNORMAL HIGH (ref 6–23)
BUN: 91 mg/dL — ABNORMAL HIGH (ref 6–23)
CHLORIDE: 96 meq/L (ref 96–112)
CO2: 19 mEq/L (ref 19–32)
CO2: 19 mEq/L (ref 19–32)
CREATININE: 7.59 mg/dL — AB (ref 0.50–1.35)
Calcium: 7.8 mg/dL — ABNORMAL LOW (ref 8.4–10.5)
Calcium: 7.7 mg/dL — ABNORMAL LOW (ref 8.4–10.5)
Chloride: 97 mEq/L (ref 96–112)
Creatinine, Ser: 7.54 mg/dL — ABNORMAL HIGH (ref 0.50–1.35)
GFR calc non Af Amer: 7 mL/min — ABNORMAL LOW (ref >90)
GFR, EST AFRICAN AMERICAN: 8 mL/min — AB (ref >90)
GFR, EST AFRICAN AMERICAN: 8 mL/min — AB (ref >90)
GFR, EST NON AFRICAN AMERICAN: 7 mL/min — AB (ref >90)
Glucose, Bld: 122 mg/dL — ABNORMAL HIGH (ref 70–99)
Glucose, Bld: 112 mg/dL — ABNORMAL HIGH (ref 70–99)
POTASSIUM: 5.9 meq/L — AB (ref 3.7–5.3)
POTASSIUM: 5.6 meq/L — AB (ref 3.7–5.3)
SODIUM: 139 meq/L (ref 137–147)
Sodium: 138 mEq/L (ref 137–147)

## 2013-07-26 LAB — CBG MONITORING, ED: GLUCOSE-CAPILLARY: 87 mg/dL (ref 70–99)

## 2013-07-26 LAB — I-STAT CG4 LACTIC ACID, ED: Lactic Acid, Venous: 0.5 mmol/L (ref 0.5–2.2)

## 2013-07-26 LAB — URINALYSIS, ROUTINE W REFLEX MICROSCOPIC
Bilirubin Urine: NEGATIVE
GLUCOSE, UA: 250 mg/dL — AB
KETONES UR: 15 mg/dL — AB
NITRITE: NEGATIVE
PH: 6 (ref 5.0–8.0)
Protein, ur: >300 mg/dL — AB
SPECIFIC GRAVITY, URINE: 1.019 (ref 1.005–1.030)
Urobilinogen, UA: 0.2 mg/dL (ref 0.0–1.0)

## 2013-07-26 LAB — STREP PNEUMONIAE URINARY ANTIGEN: Strep Pneumo Urinary Antigen: NEGATIVE

## 2013-07-26 LAB — MRSA PCR SCREENING: MRSA BY PCR: POSITIVE — AB

## 2013-07-26 MED ORDER — INSULIN GLARGINE 100 UNIT/ML ~~LOC~~ SOLN
16.0000 [IU] | Freq: Every day | SUBCUTANEOUS | Status: DC
  Administered 2013-07-27 – 2013-07-30 (×4): 16 [IU] via SUBCUTANEOUS
  Filled 2013-07-26 (×4): qty 0.16

## 2013-07-26 MED ORDER — VANCOMYCIN HCL IN DEXTROSE 1-5 GM/200ML-% IV SOLN
1000.0000 mg | INTRAVENOUS | Status: AC
  Administered 2013-07-26: 1000 mg via INTRAVENOUS
  Filled 2013-07-26: qty 200

## 2013-07-26 MED ORDER — AMLODIPINE BESYLATE 10 MG PO TABS
10.0000 mg | ORAL_TABLET | Freq: Every day | ORAL | Status: DC
  Administered 2013-07-27 – 2013-07-30 (×4): 10 mg via ORAL
  Filled 2013-07-26 (×4): qty 1

## 2013-07-26 MED ORDER — SODIUM CHLORIDE 0.9 % IV SOLN: INTRAVENOUS | Status: AC

## 2013-07-26 MED ORDER — CARVEDILOL 12.5 MG PO TABS
12.5000 mg | ORAL_TABLET | Freq: Two times a day (BID) | ORAL | Status: DC
  Administered 2013-07-26 – 2013-07-30 (×8): 12.5 mg via ORAL
  Filled 2013-07-26 (×11): qty 1

## 2013-07-26 MED ORDER — CALCITRIOL 0.25 MCG PO CAPS
0.2500 ug | ORAL_CAPSULE | ORAL | Status: DC
  Administered 2013-07-28 – 2013-07-30 (×2): 0.25 ug via ORAL
  Filled 2013-07-26 (×2): qty 1

## 2013-07-26 MED ORDER — MUPIROCIN 2 % EX OINT
1.0000 "application " | TOPICAL_OINTMENT | Freq: Two times a day (BID) | CUTANEOUS | Status: DC
  Administered 2013-07-26 – 2013-07-30 (×7): 1 via NASAL
  Filled 2013-07-26 (×4): qty 22

## 2013-07-26 MED ORDER — GUAIFENESIN-DM 100-10 MG/5ML PO SYRP
5.0000 mL | ORAL_SOLUTION | ORAL | Status: DC | PRN
  Administered 2013-07-26 – 2013-07-29 (×2): 5 mL via ORAL
  Filled 2013-07-26 (×2): qty 10

## 2013-07-26 MED ORDER — SODIUM CHLORIDE 0.9 % IV BOLUS (SEPSIS)
500.0000 mL | Freq: Once | INTRAVENOUS | Status: AC
  Administered 2013-07-26: 500 mL via INTRAVENOUS

## 2013-07-26 MED ORDER — DIPHENHYDRAMINE HCL (SLEEP) 25 MG PO TABS: 25.0000 mg | ORAL_TABLET | Freq: Four times a day (QID) | ORAL | Status: DC | PRN

## 2013-07-26 MED ORDER — ONDANSETRON HCL 4 MG/2ML IJ SOLN: 4.0000 mg | Freq: Four times a day (QID) | INTRAMUSCULAR | Status: DC | PRN

## 2013-07-26 MED ORDER — ISOSORBIDE MONONITRATE ER 30 MG PO TB24
30.0000 mg | ORAL_TABLET | Freq: Every day | ORAL | Status: DC
  Administered 2013-07-27 – 2013-07-30 (×4): 30 mg via ORAL
  Filled 2013-07-26 (×4): qty 1

## 2013-07-26 MED ORDER — FLEET ENEMA 7-19 GM/118ML RE ENEM: 1.0000 | ENEMA | Freq: Every day | RECTAL | Status: DC | PRN

## 2013-07-26 MED ORDER — LORAZEPAM 1 MG PO TABS: 1.0000 mg | ORAL_TABLET | Freq: Two times a day (BID) | ORAL | Status: DC | PRN

## 2013-07-26 MED ORDER — ADULT MULTIVITAMIN W/MINERALS CH
1.0000 | ORAL_TABLET | Freq: Every day | ORAL | Status: DC
  Administered 2013-07-27 – 2013-07-30 (×4): 1 via ORAL
  Filled 2013-07-26 (×4): qty 1

## 2013-07-26 MED ORDER — DOCUSATE SODIUM 100 MG PO CAPS
100.0000 mg | ORAL_CAPSULE | Freq: Every day | ORAL | Status: DC
  Administered 2013-07-28 – 2013-07-30 (×3): 100 mg via ORAL
  Filled 2013-07-26 (×4): qty 1

## 2013-07-26 MED ORDER — SODIUM POLYSTYRENE SULFONATE 15 GM/60ML PO SUSP
45.0000 g | Freq: Once | ORAL | Status: AC
  Administered 2013-07-26: 45 g via ORAL
  Filled 2013-07-26: qty 180

## 2013-07-26 MED ORDER — ONDANSETRON HCL 4 MG PO TABS: 4.0000 mg | ORAL_TABLET | Freq: Four times a day (QID) | ORAL | Status: DC | PRN

## 2013-07-26 MED ORDER — CLONIDINE HCL 0.1 MG PO TABS
0.1000 mg | ORAL_TABLET | Freq: Two times a day (BID) | ORAL | Status: DC | PRN
  Filled 2013-07-26: qty 1

## 2013-07-26 MED ORDER — IPRATROPIUM BROMIDE 0.02 % IN SOLN: 0.5000 mg | RESPIRATORY_TRACT | Status: DC | PRN

## 2013-07-26 MED ORDER — ACETAMINOPHEN 650 MG RE SUPP: 650.0000 mg | Freq: Four times a day (QID) | RECTAL | Status: DC | PRN

## 2013-07-26 MED ORDER — IMIPENEM-CILASTATIN 250 MG IV SOLR
250.0000 mg | Freq: Two times a day (BID) | INTRAVENOUS | Status: DC
  Administered 2013-07-26 – 2013-07-28 (×5): 250 mg via INTRAVENOUS
  Filled 2013-07-26 (×8): qty 250

## 2013-07-26 MED ORDER — SODIUM CHLORIDE 0.9 % IV SOLN
INTRAVENOUS | Status: DC
  Administered 2013-07-26: 14:00:00 via INTRAVENOUS

## 2013-07-26 MED ORDER — SODIUM BICARBONATE 650 MG PO TABS: 325.0000 mg | ORAL_TABLET | Freq: Four times a day (QID) | ORAL | Status: DC

## 2013-07-26 MED ORDER — ASPIRIN EC 81 MG PO TBEC
81.0000 mg | DELAYED_RELEASE_TABLET | Freq: Every day | ORAL | Status: DC
  Administered 2013-07-27 – 2013-07-30 (×4): 81 mg via ORAL
  Filled 2013-07-26 (×4): qty 1

## 2013-07-26 MED ORDER — SODIUM CHLORIDE 0.9 % IJ SOLN
3.0000 mL | Freq: Two times a day (BID) | INTRAMUSCULAR | Status: DC
  Administered 2013-07-27 – 2013-07-30 (×5): 3 mL via INTRAVENOUS

## 2013-07-26 MED ORDER — PROMETHAZINE HCL 25 MG PO TABS
25.0000 mg | ORAL_TABLET | Freq: Four times a day (QID) | ORAL | Status: DC | PRN
  Administered 2013-07-26 – 2013-07-30 (×6): 25 mg via ORAL
  Filled 2013-07-26 (×6): qty 1

## 2013-07-26 MED ORDER — ACETAMINOPHEN 325 MG PO TABS: 650.0000 mg | ORAL_TABLET | Freq: Four times a day (QID) | ORAL | Status: DC | PRN

## 2013-07-26 MED ORDER — DIPHENHYDRAMINE HCL 25 MG PO CAPS: 25.0000 mg | ORAL_CAPSULE | Freq: Four times a day (QID) | ORAL | Status: DC | PRN

## 2013-07-26 MED ORDER — MORPHINE SULFATE ER 15 MG PO TBCR
15.0000 mg | EXTENDED_RELEASE_TABLET | Freq: Two times a day (BID) | ORAL | Status: DC
  Administered 2013-07-26 – 2013-07-30 (×8): 15 mg via ORAL
  Filled 2013-07-26 (×8): qty 1

## 2013-07-26 MED ORDER — ALBUTEROL SULFATE (2.5 MG/3ML) 0.083% IN NEBU: 2.5000 mg | INHALATION_SOLUTION | RESPIRATORY_TRACT | Status: DC | PRN

## 2013-07-26 MED ORDER — CLOPIDOGREL BISULFATE 75 MG PO TABS
75.0000 mg | ORAL_TABLET | Freq: Every day | ORAL | Status: DC
  Administered 2013-07-27 – 2013-07-30 (×4): 75 mg via ORAL
  Filled 2013-07-26 (×4): qty 1

## 2013-07-26 MED ORDER — CHLORHEXIDINE GLUCONATE CLOTH 2 % EX PADS
6.0000 | MEDICATED_PAD | Freq: Every day | CUTANEOUS | Status: DC
  Administered 2013-07-27 – 2013-07-30 (×3): 6 via TOPICAL

## 2013-07-26 MED ORDER — SODIUM CHLORIDE 0.9 % IV SOLN
500.0000 mg | INTRAVENOUS | Status: AC
  Administered 2013-07-26: 500 mg via INTRAVENOUS
  Filled 2013-07-26: qty 500

## 2013-07-26 MED ORDER — PHENAZOPYRIDINE HCL 100 MG PO TABS
100.0000 mg | ORAL_TABLET | Freq: Three times a day (TID) | ORAL | Status: DC | PRN
  Filled 2013-07-26: qty 1

## 2013-07-26 MED ORDER — HYDROCODONE-ACETAMINOPHEN 7.5-325 MG PO TABS
1.0000 | ORAL_TABLET | Freq: Three times a day (TID) | ORAL | Status: DC
  Administered 2013-07-26 – 2013-07-30 (×11): 1 via ORAL
  Filled 2013-07-26 (×11): qty 1

## 2013-07-26 MED ORDER — PANTOPRAZOLE SODIUM 40 MG PO TBEC
40.0000 mg | DELAYED_RELEASE_TABLET | Freq: Every day | ORAL | Status: DC
  Administered 2013-07-27 – 2013-07-30 (×4): 40 mg via ORAL
  Filled 2013-07-26 (×4): qty 1

## 2013-07-26 MED ORDER — SODIUM BICARBONATE 8.4 % IV SOLN
INTRAVENOUS | Status: DC
  Administered 2013-07-26: via INTRAVENOUS
  Filled 2013-07-26 (×2): qty 75

## 2013-07-26 MED ORDER — ATORVASTATIN CALCIUM 80 MG PO TABS
80.0000 mg | ORAL_TABLET | Freq: Every day | ORAL | Status: DC
  Administered 2013-07-27 – 2013-07-30 (×4): 80 mg via ORAL
  Filled 2013-07-26 (×4): qty 1

## 2013-07-26 MED ORDER — SODIUM BICARBONATE 650 MG PO TABS
1300.0000 mg | ORAL_TABLET | Freq: Four times a day (QID) | ORAL | Status: DC
  Administered 2013-07-26 (×2): 1300 mg via ORAL
  Filled 2013-07-26 (×2): qty 2

## 2013-07-26 MED ORDER — ASPIRIN 81 MG PO TABS: 81.0000 mg | ORAL_TABLET | Freq: Every day | ORAL | Status: DC

## 2013-07-26 NOTE — Progress Notes (Signed)
ANTIBIOTIC CONSULT NOTE - INITIAL  Pharmacy Consult for vancomycin and Primaxin Indication: HCAP  Allergies  Allergen Reactions  . Contrast Media [Iodinated Diagnostic Agents] Other (See Comments)    unknown  . Iohexol      Desc: PT ALLERGIC TO CONTRAST, NEEDS FULL 13 HR PREMEDS. I DO NOT HAVE ANY OTHER INFO AT THIS TIME.  [facility] AND WE WERE MADE AWARE OF HIS ALLERGY TODAY., Onset Date: [id number]   . Metformin Other (See Comments)    unknown  . Noroxin [Norfloxacin] Other (See Comments)    unknown  . Piperacillin Sod-Tazobactam So Other (See Comments)    unknown  . Reglan [Metoclopramide] Other (See Comments)    unknown    Patient Measurements:   From 07/22/13: Weight 83.9 kg Height  180 cm  Vital Signs: Temp: 97.8 F (36.6 C) (03/09 1140) Temp src: Axillary (03/09 1140) BP: 149/96 mmHg (03/09 1145) Pulse Rate: 69 (03/09 1145) Intake/Output from previous day:   Intake/Output from this shift:    Labs:  Recent Labs  07/26/13 1150  WBC 12.2*  HGB 8.9*  PLT 469*   The CrCl is unknown because both a height and weight (above a minimum accepted value) are required for this calculation. No results found for this basename: VANCOTROUGH, VANCOPEAK, VANCORANDOM, GENTTROUGH, GENTPEAK, GENTRANDOM, TOBRATROUGH, TOBRAPEAK, TOBRARND, AMIKACINPEAK, AMIKACINTROU, AMIKACIN,  in the last 72 hours   Microbiology: No results found for this or any previous visit (from the past 720 hour(s)).  Medical History: Past Medical History  Diagnosis Date  . CAD (coronary artery disease)   . Paraplegia   . History of MRSA infection   . Decubitus ulcer     multiple  . Anxiety   . Depression   . Gout   . GERD (gastroesophageal reflux disease)   . MI (myocardial infarction)     2010  . H/O: GI bleed   . Schatzki's ring   . Chronic kidney disease   . Unspecified essential hypertension   . Hyperlipemia   . Diabetes mellitus     diet controlled    Medications:   Scheduled:   Infusions:  . sodium chloride    . sodium chloride    . vancomycin     PRN:   Assessment: 58 y/o M with LE paraplegia, suprapubic catheter, recent UTI was [facility] with shortness of breath, AMS, and cough productive of green sputum.  Orders received to begin empiric vancomycin and Primaxin for HCAP with pharmacy dosing assistance.  According to chart patient is allergic to Zosyn (reaction unknown), but appears he has received Primaxin in the past.   History of CKD noted; current serum creatinine is pending.  Goal of Therapy:  Appropriate antibiotic therapy for renal function; eradication of infection Vancomycin trough 15-20  Plan:  1. Primaxin 500 mg IV stat x 1 2. Vancomycin 1000 mg IV stat x 1 3. Await serum creatinine for estimation of current creatinine clearance and calculation of antibiotic maintenance dosages.  Elie Goodyandy Domonique Cothran, PharmD, BCPS Pager: [phone] 07/26/2013  12:33 PM

## 2013-07-26 NOTE — ED Notes (Signed)
Bilateral lower extremities wrapped in kerlex. Clean, dry, and intact. Patient's mother is unsure what the skin is like underneath. He also has what appears to be a stage 2 ulcer to his back.

## 2013-07-26 NOTE — ED Notes (Addendum)
Pt has colostomy and suprapubic catheter. Pt physically unable to have rectal temperature checked. Pt reports MRSA and staff "all over".  Staff at facility reported pt temperature was 102

## 2013-07-26 NOTE — ED Notes (Signed)
Bed: WA17 Expected date:  Expected time:  Means of arrival:  Comments: Res a 

## 2013-07-26 NOTE — ED Notes (Signed)
Per EMS-Patient is resident of [facility]. Patient has had increased SOB and altered mental status changes. Diminished lung sounds in the lower lobes and a productive cough with green sputum.
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
## 2013-07-26 NOTE — Progress Notes (Signed)
eLink Physician-Brief Progress Note Patient Name: [name] DOB: Mar 23, 1956 MRN: [id number]  Date of Service  07/26/2013   HPI/Events of Note  k 5.9  eICU Interventions  Add bicarb drip, dc oral bicarb, dose kayxlate, repeat bmet frequent   Intervention Category Major Interventions: Electrolyte abnormality - evaluation and management  FEINSTEIN,DANIEL J. 07/26/2013, 10:31 PM
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
## 2013-07-26 NOTE — Progress Notes (Signed)
ANTIBIOTIC CONSULT NOTE - INITIAL  Pharmacy Consult for vancomycin and Primaxin Indication: HCAP  Allergies  Allergen Reactions  . Contrast Media [Iodinated Diagnostic Agents] Other (See Comments)    unknown  . Iohexol      Desc: PT ALLERGIC TO CONTRAST, NEEDS FULL 13 HR PREMEDS. I DO NOT HAVE ANY OTHER INFO AT THIS TIME.  PT RESIDES AT [facility] AND WE WERE MADE AWARE OF HIS ALLERGY TODAY., Onset Date: [id number]   . Metformin Other (See Comments)    unknown  . Noroxin [Norfloxacin] Other (See Comments)    unknown  . Piperacillin Sod-Tazobactam So Other (See Comments)    unknown  . Reglan [Metoclopramide] Other (See Comments)    unknown    Patient Measurements:   From 07/22/13: Weight 83.9 kg Height  180 cm  Vital Signs: Temp: 97.8 F (36.6 C) (03/09 1140) Temp src: Axillary (03/09 1140) BP: 142/73 mmHg (03/09 1300) Pulse Rate: 63 (03/09 1300) Intake/Output from previous day:   Intake/Output from this shift:    Labs:  Recent Labs  07/26/13 1150 07/26/13 1245  WBC 12.2*  --   HGB 8.9*  --   PLT 469*  --   CREATININE  --  7.94*   The CrCl is unknown because both a height and weight (above a minimum accepted value) are required for this calculation. No results found for this basename: VANCOTROUGH, VANCOPEAK, VANCORANDOM, GENTTROUGH, GENTPEAK, GENTRANDOM, TOBRATROUGH, TOBRAPEAK, TOBRARND, AMIKACINPEAK, AMIKACINTROU, AMIKACIN,  in the last 72 hours   Microbiology: No results found for this or any previous visit (from the past 720 hour(s)).  Medical History: Past Medical History  Diagnosis Date  . CAD (coronary artery disease)   . Paraplegia   . History of MRSA infection   . Decubitus ulcer     multiple  . Anxiety   . Depression   . Gout   . GERD (gastroesophageal reflux disease)   . MI (myocardial infarction)     2010  . H/O: GI bleed   . Schatzki's ring   . Chronic kidney disease   . Unspecified essential hypertension   . Hyperlipemia   . Diabetes  mellitus     diet controlled    Medications:  Scheduled:   Infusions:  . sodium chloride 125 mL/hr at 07/26/13 1332  . sodium chloride 500 mL (07/26/13 1254)  . vancomycin 1,000 mg (07/26/13 1333)   PRN:   Assessment: 58 y/o M with LE paraplegia, suprapubic catheter, recent UTI was brought from [place] with shortness of breath, AMS, and cough productive of green sputum.  Orders received to begin empiric vancomycin and Primaxin for HCAP with pharmacy dosing assistance.  According to chart patient is allergic to Zosyn (reaction unknown), but appears he has received Primaxin in the past.   History of CKD noted; current serum creatinine is pending.  Goal of Therapy:  Appropriate antibiotic therapy for renal function; eradication of infection Vancomycin trough 15-20  Plan:  1. Primaxin 500 mg IV stat x 1 2. Vancomycin 1000 mg IV stat x 1 3. Await serum creatinine for estimation of current creatinine clearance and calculation of antibiotic maintenance dosages.  Elie Goody, PharmD, BCPS Pager: [phone] 07/26/13  12:18    ADDENDUM: Serum creatinine now reported as 7.94, consistent with acute on chronic renal failure.  Estimated CrCl ~ 10 mL/min/1.45m2  Updated Plan: 1.  Check vancomycin serum levels to guide further dosing: first level (random) with tomorrow AM's labs 2.  Primaxin 250 mg IV q12h, next dose  tonight at 10pm 3.  Follow serum creatinine for further adjustment of Primaxin dosing.  Elie Goodyandy [place], PharmD, BCPS Pager: [phone] 07/26/2013  1:51 PM

## 2013-07-27 LAB — BASIC METABOLIC PANEL
BUN: 84 mg/dL — ABNORMAL HIGH (ref 6–23)
BUN: 82 mg/dL — ABNORMAL HIGH (ref 6–23)
CALCIUM: 7.5 mg/dL — AB (ref 8.4–10.5)
CHLORIDE: 96 meq/L (ref 96–112)
CO2: 20 meq/L (ref 19–32)
CO2: 21 mEq/L (ref 19–32)
Calcium: 7.6 mg/dL — ABNORMAL LOW (ref 8.4–10.5)
Chloride: 98 mEq/L (ref 96–112)
Creatinine, Ser: 7.63 mg/dL — ABNORMAL HIGH (ref 0.50–1.35)
Creatinine, Ser: 7.72 mg/dL — ABNORMAL HIGH (ref 0.50–1.35)
GFR calc Af Amer: 8 mL/min — ABNORMAL LOW (ref >90)
GFR calc Af Amer: 8 mL/min — ABNORMAL LOW (ref >90)
GFR calc non Af Amer: 7 mL/min — ABNORMAL LOW (ref >90)
GFR, EST NON AFRICAN AMERICAN: 7 mL/min — AB (ref >90)
GLUCOSE: 119 mg/dL — AB (ref 70–99)
Glucose, Bld: 128 mg/dL — ABNORMAL HIGH (ref 70–99)
POTASSIUM: 4.3 meq/L (ref 3.7–5.3)
Potassium: 4.4 mEq/L (ref 3.7–5.3)
SODIUM: 139 meq/L (ref 137–147)
Sodium: 139 mEq/L (ref 137–147)

## 2013-07-27 LAB — COMPREHENSIVE METABOLIC PANEL
ALK PHOS: 113 U/L (ref 39–117)
ALT: 12 U/L (ref 0–53)
AST: 17 U/L (ref 0–37)
Albumin: 3.2 g/dL — ABNORMAL LOW (ref 3.5–5.2)
BUN: 91 mg/dL — ABNORMAL HIGH (ref 6–23)
CO2: 20 mEq/L (ref 19–32)
Calcium: 7.9 mg/dL — ABNORMAL LOW (ref 8.4–10.5)
Chloride: 98 mEq/L (ref 96–112)
Creatinine, Ser: 7.55 mg/dL — ABNORMAL HIGH (ref 0.50–1.35)
GFR, EST AFRICAN AMERICAN: 8 mL/min — AB (ref >90)
GFR, EST NON AFRICAN AMERICAN: 7 mL/min — AB (ref >90)
GLUCOSE: 137 mg/dL — AB (ref 70–99)
POTASSIUM: 4.8 meq/L (ref 3.7–5.3)
SODIUM: 141 meq/L (ref 137–147)
Total Bilirubin: 0.4 mg/dL (ref 0.3–1.2)
Total Protein: 7.2 g/dL (ref 6.0–8.3)

## 2013-07-27 LAB — GLUCOSE, CAPILLARY
GLUCOSE-CAPILLARY: 111 mg/dL — AB (ref 70–99)
Glucose-Capillary: 122 mg/dL — ABNORMAL HIGH (ref 70–99)

## 2013-07-27 LAB — INFLUENZA PANEL BY PCR (TYPE A & B)
H1N1 flu by pcr: NOT DETECTED
Influenza A By PCR: NEGATIVE
Influenza B By PCR: NEGATIVE

## 2013-07-27 LAB — CBC
HCT: 25.2 % — ABNORMAL LOW (ref 39.0–52.0)
Hemoglobin: 7.9 g/dL — ABNORMAL LOW (ref 13.0–17.0)
MCH: 27.2 pg (ref 26.0–34.0)
MCHC: 31.3 g/dL (ref 30.0–36.0)
MCV: 86.9 fL (ref 78.0–100.0)
Platelets: 363 10*3/uL (ref 150–400)
RBC: 2.9 MIL/uL — ABNORMAL LOW (ref 4.22–5.81)
RDW: 15.2 % (ref 11.5–15.5)
WBC: 10.8 10*3/uL — ABNORMAL HIGH (ref 4.0–10.5)

## 2013-07-27 LAB — URINE CULTURE

## 2013-07-27 LAB — LEGIONELLA ANTIGEN, URINE: Legionella Antigen, Urine: NEGATIVE

## 2013-07-27 LAB — VANCOMYCIN, RANDOM: Vancomycin Rm: 13.2 ug/mL

## 2013-07-27 MED ORDER — SODIUM CHLORIDE 0.9 % IV SOLN
INTRAVENOUS | Status: DC
  Administered 2013-07-27: 11:00:00 via INTRAVENOUS

## 2013-07-27 MED ORDER — BOOST / RESOURCE BREEZE PO LIQD
1.0000 | Freq: Two times a day (BID) | ORAL | Status: DC
Start: 2013-07-27 — End: 2013-07-30
  Administered 2013-07-28: 1 via ORAL

## 2013-07-27 MED ORDER — HYDROMORPHONE HCL PF 1 MG/ML IJ SOLN
1.0000 mg | INTRAMUSCULAR | Status: DC | PRN
  Administered 2013-07-27 – 2013-07-30 (×15): 1 mg via INTRAVENOUS
  Filled 2013-07-27 (×14): qty 1

## 2013-07-27 MED ORDER — INSULIN ASPART 100 UNIT/ML ~~LOC~~ SOLN
0.0000 [IU] | Freq: Three times a day (TID) | SUBCUTANEOUS | Status: DC
  Administered 2013-07-29: 1 [IU] via SUBCUTANEOUS

## 2013-07-27 MED ORDER — INSULIN ASPART 100 UNIT/ML ~~LOC~~ SOLN: 0.0000 [IU] | Freq: Every day | SUBCUTANEOUS | Status: DC

## 2013-07-27 MED ORDER — VANCOMYCIN HCL IN DEXTROSE 1-5 GM/200ML-% IV SOLN
1000.0000 mg | INTRAVENOUS | Status: DC
  Administered 2013-07-27: 1000 mg via INTRAVENOUS
  Filled 2013-07-27 (×2): qty 200
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
## 2013-07-27 NOTE — Progress Notes (Signed)
ANTIBIOTIC CONSULT NOTE - Follow Up  Pharmacy Consult for Vancomycin and Primaxin Indication: HCAP  Allergies  Allergen Reactions  . Contrast Media [Iodinated Diagnostic Agents] Other (See Comments)    unknown  . Iohexol      Desc: PT ALLERGIC TO CONTRAST, NEEDS FULL 13 HR PREMEDS. I DO NOT HAVE ANY OTHER INFO AT THIS TIME.  PT RESIDES AT [facility] AND WE WERE MADE AWARE OF HIS ALLERGY TODAY., Onset Date: [id number]   . Metformin Other (See Comments)    unknown  . Noroxin [Norfloxacin] Other (See Comments)    unknown  . Piperacillin Sod-Tazobactam So Other (See Comments)    unknown  . Reglan [Metoclopramide] Other (See Comments)    unknown    Patient Measurements: Height: 5\' 10"  (177.8 cm) Weight: 195 lb 5.2 oz (88.6 kg) IBW/kg (Calculated) : 73  Vital Signs: Temp: 97.9 F (36.6 C) (03/09 2321) Temp src: Oral (03/09 2321) BP: 169/80 mmHg (03/10 0200) Pulse Rate: 71 (03/10 0600) Intake/Output from previous day: 03/09 0701 - 03/10 0700 In: 1741.3 [P.O.:630; I.V.:1011.3; IV Piggyback:100] Out: 550 [Urine:550] Intake/Output from this shift:    Labs:  Recent Labs  07/26/13 1150  07/26/13 2041 07/26/13 2317 07/27/13 0520  WBC 12.2*  --   --   --  10.8*  HGB 8.9*  --   --   --  7.9*  PLT 469*  --   --   --  363  CREATININE  --   < > 7.54* 7.59* 7.55*  < > = values in this interval not displayed. Estimated Creatinine Clearance: 12.4 ml/min (by C-G formula based on Cr of 7.55).  Recent Labs  07/27/13 0520  VANCORANDOM 13.2     Microbiology: Recent Results (from the past 720 hour(s))  MRSA PCR SCREENING     Status: Abnormal   Collection Time    07/26/13  4:16 PM      Result Value Ref Range Status   MRSA by PCR POSITIVE (*) NEGATIVE Final   Comment:            The GeneXpert MRSA Assay (FDA     approved for NASAL specimens     only), is one component of a     comprehensive MRSA colonization     surveillance program. It is not     intended to diagnose MRSA      infection nor to guide or     monitor treatment for     MRSA infections.     RESULT CALLED TO, READ BACK BY AND VERIFIED WITH:     Enrique Sack [id number] @ 1959 BY J SCOTTON  3/9 blood x2: sent 3/9 urine: sent  Medications:  Scheduled:  . amLODipine  10 mg Oral Daily  . aspirin EC  81 mg Oral Daily  . atorvastatin  80 mg Oral Daily  . [START ON 07/28/2013] calcitRIOL  0.25 mcg Oral QODAY  . carvedilol  12.5 mg Oral BID WC  . Chlorhexidine Gluconate Cloth  6 each Topical Q0600  . clopidogrel  75 mg Oral Daily  . docusate sodium  100 mg Oral Daily  . HYDROcodone-acetaminophen  1 tablet Oral TID  . imipenem-cilastatin  250 mg Intravenous Q12H  . insulin glargine  16 Units Subcutaneous Daily  . isosorbide mononitrate  30 mg Oral Daily  . morphine  15 mg Oral Q12H  . multivitamin with minerals  1 tablet Oral Daily  . mupirocin ointment  1 application Nasal BID  . pantoprazole  40 mg Oral Daily  . sodium chloride  3 mL Intravenous Q12H   Infusions:  .  sodium bicarbonate  infusion 1000 mL 75 mL/hr at 07/26/13 2331    Assessment: 58 y/o M with LE paraplegia, suprapubic catheter, recent UTI [facility] with shortness of breath, AMS, and cough productive of green sputum.  Orders received 3/9 to begin empiric Vancomycin and Primaxin for HCAP with pharmacy dosing assistance.  According to chart patient is allergic to Zosyn (reaction unknown), but appears he has received Primaxin in the past. Acute on chronic renal failure  Goal of Therapy:  Appropriate antibiotic therapy for renal function; eradication of infection Vancomycin trough 15-20  Plan:  1. Vancomycin 1g IV q48h 2. Continue Primaxin 250mg  IV q12h 3. Follow up renal function & cultures  Loralee PacasErin Katlen Seyer, PharmD, BCPS Pager: [phone] 07/27/2013 7:14 AM
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
## 2013-07-27 NOTE — Consult Note (Signed)
WOC wound consult note Reason for Consult: Multiple pressure ulcers to lower extremities.  Non-pressure related lesion to upper back.  All present on admission.  Wound type: Pressure ulcer and unknown etiology to upper back.  Pressure Ulcer POA: Yes Measurement: Right medial knee 2 cm x 1 cm x 0.2 cm Right calf 1 cm x 1 cm with scattered abrasions to calf and feet, near toes.  Left calf 1 cm x 1.5 cm x 0.1 cm Upper mid back 2 cm x 1.5 cm x 1 cm  Wound bed: Scabbed to lower extremities, bleeds easily with cleansing.  Wound bed to back is 50% pale pink, 50% white/yellow atypical appearance.  Patient states he was supposed to see dermatologist, but has been unable to do so.  Drainage (amount, consistency, odor) Legs bleed easily and heavily with cleansing.  Periwound: Intact to legs.  Periwound on mid-back is atypical in appearance with a thick, white/yellow calloused appearance. Recommended to patient and mother to follow up with dermatologist.  Dressing procedure/placement/frequency: Cleanse ulcer to back with NS and pat gently dry.  Appy NS moistened gauze  To wound bed.  TOp with dry gauze and secure with tape.  Change daily.  Cleanse ulcers to lower legs with NS and pat gently dry.  Apply vaseline gauze to wound bed, top with 4x4 gauze and secure with kerlix and tape.  Change daily.   Apply SensiCare #3 barrier cream to stage II pressure ulcer to right knee daily.  Will not follow at this time.  Please re-consult if needed.  Paul HudsonKaren Tadd Holtmeyer RN BSN CWON Pager [phone]

## 2013-07-27 NOTE — Progress Notes (Signed)
Thank you for consulting the Palliative Medicine Team at [facility] to meet your patient's and family's needs.   The reason that you asked us to see your patient is  For Clarification of GOC and options  We have scheduled your patient for a meeting: 07-28-13 at 1000 am with Lorinda CreedMary Wynonna Fitzhenry NP  The Surrogate decision make is: Elenora FenderSadie Krotz, mother Contact information:#[phone]   Your patient is able/unable to participate:yes  Lorinda CreedMary Irmgard Rampersaud NP  Palliative Medicine Team Team Phone # [phone] Pager [phone]

## 2013-07-27 NOTE — Progress Notes (Signed)
CSW received referral that [facility].  [facility] consult and [facility], pt interested in exploring other SNF facilities. However, CSW noted that pt scheduled for PMT GOC meeting tomorrow at 10 am in order to address pt goals of care as pt is currently refusing HD.  CSW to follow up following PMT GOC in order to assist with appropriate disposition needs.   CSW to complete full psychosocial assessment at that time.  Paul SpecterSuzanna Oluwanifemi [name], MSW, LCSW Clinical Social Work [phone]

## 2013-07-28 DIAGNOSIS — Z515 Encounter for palliative care: Secondary | ICD-10-CM

## 2013-07-28 DIAGNOSIS — N184 Chronic kidney disease, stage 4 (severe): Secondary | ICD-10-CM

## 2013-07-28 DIAGNOSIS — R5383 Other fatigue: Secondary | ICD-10-CM

## 2013-07-28 DIAGNOSIS — E119 Type 2 diabetes mellitus without complications: Secondary | ICD-10-CM

## 2013-07-28 DIAGNOSIS — R5381 Other malaise: Secondary | ICD-10-CM

## 2013-07-28 LAB — BASIC METABOLIC PANEL
BUN: 83 mg/dL — ABNORMAL HIGH (ref 6–23)
CALCIUM: 7.7 mg/dL — AB (ref 8.4–10.5)
CO2: 21 mEq/L (ref 19–32)
CREATININE: 7.48 mg/dL — AB (ref 0.50–1.35)
Chloride: 96 mEq/L (ref 96–112)
GFR, EST AFRICAN AMERICAN: 8 mL/min — AB (ref >90)
GFR, EST NON AFRICAN AMERICAN: 7 mL/min — AB (ref >90)
Glucose, Bld: 92 mg/dL (ref 70–99)
Potassium: 4.4 mEq/L (ref 3.7–5.3)
Sodium: 137 mEq/L (ref 137–147)

## 2013-07-28 LAB — GLUCOSE, CAPILLARY
GLUCOSE-CAPILLARY: 95 mg/dL (ref 70–99)
GLUCOSE-CAPILLARY: 110 mg/dL — AB (ref 70–99)
Glucose-Capillary: 79 mg/dL (ref 70–99)
Glucose-Capillary: 104 mg/dL — ABNORMAL HIGH (ref 70–99)

## 2013-07-28 LAB — CBC
HCT: 25.7 % — ABNORMAL LOW (ref 39.0–52.0)
Hemoglobin: 7.9 g/dL — ABNORMAL LOW (ref 13.0–17.0)
MCH: 27 pg (ref 26.0–34.0)
MCHC: 30.7 g/dL (ref 30.0–36.0)
MCV: 87.7 fL (ref 78.0–100.0)
PLATELETS: 380 10*3/uL (ref 150–400)
RBC: 2.93 MIL/uL — ABNORMAL LOW (ref 4.22–5.81)
RDW: 15.3 % (ref 11.5–15.5)
WBC: 11.2 10*3/uL — ABNORMAL HIGH (ref 4.0–10.5)

## 2013-07-28 MED ORDER — CYCLOBENZAPRINE HCL 5 MG PO TABS
5.0000 mg | ORAL_TABLET | Freq: Three times a day (TID) | ORAL | Status: DC | PRN
  Administered 2013-07-28 – 2013-07-30 (×4): 5 mg via ORAL
  Filled 2013-07-28 (×4): qty 1

## 2013-07-28 NOTE — Progress Notes (Addendum)
Patient  Refused prevalon boots.
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
## 2013-07-28 NOTE — Progress Notes (Signed)
Patient O2 sats upper 90's on  Room air.  Patient insist upon 2 liters [place].  He states "It makes me feel better".

## 2013-07-28 NOTE — Progress Notes (Signed)
Clinical Social Work Department BRIEF PSYCHOSOCIAL ASSESSMENT 07/28/2013  Patient:  [name], [name]     Account Number:  [id number]     Admit date:  07/26/2013  Clinical Social Worker:  Ulyess Blossom  Date/Time:  07/28/2013 03:40 PM  Referred by:  Physician  Date Referred:  07/28/2013 Referred for  SNF Placement   Other Referral:   Interview type:  Patient Other interview type:    PSYCHOSOCIAL DATA Living Status:  FACILITY Admitted from facility:  [facility] Level of care:  [place] Primary support name:  Sadie Merten/mother/[phone] Primary support relationship to patient:  PARENT Degree of support available:   adequate    CURRENT CONCERNS Current Concerns  Post-Acute Placement   Other Concerns:    SOCIAL WORK ASSESSMENT / PLAN CSW received referral that pt admitted from [facility]. CSW received phone call from PMT NP, Wadie Lessen following goals of care meeting stating that pt is still continuing to make decision, but pt has made decision that he does not want HD, but is still making decisions about wanting hospice care. PMT NP, Wadie Lessen plans to meet with pt and pt family again tomorrow for continued discussion.    CSW met with pt alone at bedside. CSW introduced self and explained role. Pt confirmed that he is currently a resident at [facility]. Pt discussed that he has been at the facility for the past 7 years. CSW inquired with pt about reports of wanting to explore other faciliteis. Pt discussed that his son is looking into [facility] in [place]. CSW offered to fax pt clinical information to [facility] as the facility will want to review pt medical information and pt discussed that he would like to hear back from his son before CSW sends information to [facility].    CSW provided support as pt discussed that he has a great relationship with his roommate at [facility]. Pt reports  that he has been in multiple facilities and has found that one of the most important aspects of the facility is if you have a good roommate. Pt reports that he has had the same roommate at [facility] for the past 3 1/2 years and this will weigh into his decisions.    Pt agreeable with CSW following up tomorrow in order to continue discussion regarding plan of care and if pt wants initiation of SNF search to [facility].    CSW provided pt with CSW contact information if needed before CSW follows up tomorrow.    CSW to continue to follow to provide support and assist with pt disposition needs.   Assessment/plan status:  Psychosocial Support/Ongoing Assessment of Needs Other assessment/ plan:   discharge planning   Information/referral to community resources:   Referral back to [place] and discussion about referral to [facility], but pt does not yet want CSW to send [facility] any medical information    PATIENT'S/FAMILY'S RESPONSE TO PLAN OF CARE: Pt alert and oriented x 4. Pt appears to be weighing options and making decisions about further medical care. Pt holds a lot of value to the fact that he has a strong relationship with roommmate at [facility] and pt wants to gather further information from pt son before exploring potential option of [facility].    Alison Murray, MSW, [place] Work [phone]

## 2013-07-28 NOTE — Progress Notes (Signed)
Report called to Delorise ShinerGrace, RN.  Patient transporting via bed to room 1436.

## 2013-07-28 NOTE — Progress Notes (Signed)
Patient has refused to allow turning today.  Reinforced patient education concerning the importance of turning to aid in the prevention of pressure ulcers and to allow present pressure ulcers to heal.

## 2013-07-28 NOTE — Progress Notes (Signed)
Bright, red tissue around supra pubic catheter site.  Cleansed and applied dressing.

## 2013-07-29 DIAGNOSIS — D638 Anemia in other chronic diseases classified elsewhere: Secondary | ICD-10-CM

## 2013-07-29 LAB — CBC
HCT: 23.6 % — ABNORMAL LOW (ref 39.0–52.0)
Hemoglobin: 7.3 g/dL — ABNORMAL LOW (ref 13.0–17.0)
MCH: 27 pg (ref 26.0–34.0)
MCHC: 30.9 g/dL (ref 30.0–36.0)
MCV: 87.4 fL (ref 78.0–100.0)
PLATELETS: 353 10*3/uL (ref 150–400)
RBC: 2.7 MIL/uL — ABNORMAL LOW (ref 4.22–5.81)
RDW: 15.4 % (ref 11.5–15.5)
WBC: 13.1 10*3/uL — AB (ref 4.0–10.5)

## 2013-07-29 LAB — BASIC METABOLIC PANEL
BUN: 84 mg/dL — ABNORMAL HIGH (ref 6–23)
CHLORIDE: 98 meq/L (ref 96–112)
CO2: 19 mEq/L (ref 19–32)
CREATININE: 7.44 mg/dL — AB (ref 0.50–1.35)
Calcium: 7.4 mg/dL — ABNORMAL LOW (ref 8.4–10.5)
GFR calc non Af Amer: 7 mL/min — ABNORMAL LOW (ref >90)
GFR, EST AFRICAN AMERICAN: 8 mL/min — AB (ref >90)
Glucose, Bld: 87 mg/dL (ref 70–99)
Potassium: 4 mEq/L (ref 3.7–5.3)
Sodium: 140 mEq/L (ref 137–147)

## 2013-07-29 LAB — GLUCOSE, CAPILLARY
Glucose-Capillary: 93 mg/dL (ref 70–99)
Glucose-Capillary: 87 mg/dL (ref 70–99)
Glucose-Capillary: 128 mg/dL — ABNORMAL HIGH (ref 70–99)
Glucose-Capillary: 107 mg/dL — ABNORMAL HIGH (ref 70–99)
Glucose-Capillary: 102 mg/dL — ABNORMAL HIGH (ref 70–99)

## 2013-07-29 MED ORDER — LEVOFLOXACIN 750 MG PO TABS
750.0000 mg | ORAL_TABLET | Freq: Once | ORAL | Status: AC
  Administered 2013-07-29: 750 mg via ORAL
  Filled 2013-07-29: qty 1

## 2013-07-29 MED ORDER — LEVOFLOXACIN 500 MG PO TABS: 500.0000 mg | ORAL_TABLET | ORAL | Status: DC

## 2013-07-29 NOTE — Progress Notes (Signed)
ANTIBIOTIC CONSULT NOTE - INITIAL  Pharmacy Consult for Levaquin Indication: HCAP  Allergies  Allergen Reactions  . Contrast Media [Iodinated Diagnostic Agents] Other (See Comments)    unknown  . Iohexol      Desc: PT ALLERGIC TO CONTRAST, NEEDS FULL 13 HR PREMEDS. I DO NOT HAVE ANY OTHER INFO AT THIS TIME.  PT RESIDES AT [facility] AND WE WERE MADE AWARE OF HIS ALLERGY TODAY., Onset Date: [id number]   . Metformin Other (See Comments)    unknown  . Noroxin [Norfloxacin] Other (See Comments)    unknown  . Piperacillin Sod-Tazobactam So Other (See Comments)    unknown  . Reglan [Metoclopramide] Other (See Comments)    unknown    Patient Measurements: Height: 5\' 10"  (177.8 cm) Weight: 193 lb 5.5 oz (87.7 kg) IBW/kg (Calculated) : 73  Vital Signs: Temp: 98.4 F (36.9 C) (03/12 0424) Temp src: Oral (03/12 0424) BP: 168/75 mmHg (03/12 0424) Pulse Rate: 72 (03/12 0424) Intake/Output from previous day: 03/11 0701 - 03/12 0700 In: 310 [IV Piggyback:200] Out: 1175 [Urine:1175] Intake/Output from this shift: Total I/O In: 120 [P.O.:120] Out: -   Labs:  Recent Labs  07/27/13 0520  07/27/13 1836 07/28/13 0306 07/29/13 0410  WBC 10.8*  --   --  11.2* 13.1*  HGB 7.9*  --   --  7.9* 7.3*  PLT 363  --   --  380 353  CREATININE 7.55*  < > 7.72* 7.48* 7.44*  < > = values in this interval not displayed. Estimated Creatinine Clearance: 12.5 ml/min (by C-G formula based on Cr of 7.44).  Recent Labs  07/27/13 0520  [facility] 13.2     Microbiology: Recent Results (from the past 720 hour(s))  CULTURE, BLOOD (ROUTINE X 2)     Status: None   Collection Time    07/26/13 12:30 PM      Result Value Ref Range Status   Specimen Description BLOOD LEFT ANTECUBITAL   Final   Special Requests BOTTLES DRAWN AEROBIC AND ANAEROBIC   Final   Culture  Setup Time     Final   Value: 07/26/2013 16:33     Performed at [facility]   Culture     Final   Value:        BLOOD  CULTURE RECEIVED NO GROWTH TO DATE CULTURE WILL BE HELD FOR 5 DAYS BEFORE ISSUING A FINAL NEGATIVE REPORT     Performed at [facility]   Report Status PENDING   Incomplete  CULTURE, BLOOD (ROUTINE X 2)     Status: None   Collection Time    07/26/13 12:45 PM      Result Value Ref Range Status   Specimen Description BLOOD RIGHT ANTECUBITAL   Final   Special Requests BOTTLES DRAWN AEROBIC AND ANAEROBIC   Final   Culture  Setup Time     Final   Value: 07/26/2013 16:33     Performed at [facility]   Culture     Final   Value:        BLOOD CULTURE RECEIVED NO GROWTH TO DATE CULTURE WILL BE HELD FOR 5 DAYS BEFORE ISSUING A FINAL NEGATIVE REPORT     Performed at [facility]   Report Status PENDING   Incomplete  MRSA PCR SCREENING     Status: Abnormal   Collection Time    07/26/13  4:16 PM      Result Value Ref Range Status  MRSA by PCR POSITIVE (*) NEGATIVE Final   Comment:            The GeneXpert MRSA Assay (FDA     approved for NASAL specimens     only), is one component of a     comprehensive MRSA colonization     surveillance program. It is not     intended to diagnose MRSA     infection nor to guide or     monitor treatment for     MRSA infections.     RESULT CALLED TO, READ BACK BY AND VERIFIED WITH:     Enrique Sack [id number] @ 1957 BY J SCOTTON  URINE CULTURE     Status: None   Collection Time    07/26/13  4:49 PM      Result Value Ref Range Status   Specimen Description URINE, RANDOM   Final   Special Requests NONE   Final   Culture  Setup Time     Final   Value: 07/26/2013 21:53     Performed at [facility]   Colony Count     Final   Value: >=100,000 COLONIES/ML     Performed at [facility]   Culture     Final   Value: Multiple bacterial morphotypes present, none predominant. Suggest appropriate recollection if clinically indicated.     Performed at [facility]   Report Status 07/27/2013 FINAL   Final     Medical History: Past Medical History  Diagnosis Date  . CAD (coronary artery disease)   . Paraplegia   . History of MRSA infection   . Decubitus ulcer     multiple  . Anxiety   . Depression   . Gout   . GERD (gastroesophageal reflux disease)   . MI (myocardial infarction)     2010  . H/O: GI bleed   . Schatzki's ring   . Chronic kidney disease   . Unspecified essential hypertension   . Hyperlipemia   . Diabetes mellitus     diet controlled    Medications:  Scheduled:  . amLODipine  10 mg Oral Daily  . aspirin EC  81 mg Oral Daily  . atorvastatin  80 mg Oral Daily  . calcitRIOL  0.25 mcg Oral QODAY  . carvedilol  12.5 mg Oral BID WC  . Chlorhexidine Gluconate Cloth  6 each Topical Q0600  . clopidogrel  75 mg Oral Daily  . docusate sodium  100 mg Oral Daily  . feeding supplement (RESOURCE BREEZE)  1 Container Oral BID BM  . HYDROcodone-acetaminophen  1 tablet Oral TID  . insulin aspart  0-5 Units Subcutaneous QHS  . insulin aspart  0-9 Units Subcutaneous TID WC  . insulin glargine  16 Units Subcutaneous Daily  . isosorbide mononitrate  30 mg Oral Daily  . [START ON 07/31/2013] levofloxacin  500 mg Oral Q48H  . levofloxacin  750 mg Oral Once  . morphine  15 mg Oral Q12H  . multivitamin with minerals  1 tablet Oral Daily  . mupirocin ointment  1 application Nasal BID  . pantoprazole  40 mg Oral Daily  . sodium chloride  3 mL Intravenous Q12H   Infusions:    Assessment: 58 y/o M with LE paraplegia, suprapubic catheter, recent UTI was brought from [facility] with shortness of breath, AMS, and cough productive of green sputum. Orders received 3/9 to begin empiric Vancomycin and Primaxin for HCAP with pharmacy dosing assistance. According to  chart patient is allergic to Zosyn (reaction unknown), but appears he has received Primaxin in the past. Acute on chronic renal failure  WBC slightly elevated   Afebrile  Renal failure, SCr > 7 with est CrCl of 12  ml/min  Goal of Therapy:  eradication of infectioni  Plan: Day #1 Levaquin (Day #4 Total Abx's) 1) For CrCl 10-19 ml/min, start Levaquin 750mg  PO x 1 then 500mg  PO q48hr 2) Will continue to monitor renal function for any changes necessary  Hessie KnowsJustin M Zykee Avakian, PharmD, BCPS Pager [phone] 07/29/2013 11:29 AM
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
## 2013-07-30 LAB — GLUCOSE, CAPILLARY: Glucose-Capillary: 92 mg/dL (ref 70–99)

## 2013-07-30 LAB — CBC
HEMATOCRIT: 23 % — AB (ref 39.0–52.0)
Hemoglobin: 7.2 g/dL — ABNORMAL LOW (ref 13.0–17.0)
MCH: 27.5 pg (ref 26.0–34.0)
MCHC: 31.3 g/dL (ref 30.0–36.0)
MCV: 87.8 fL (ref 78.0–100.0)
Platelets: 337 10*3/uL (ref 150–400)
RBC: 2.62 MIL/uL — AB (ref 4.22–5.81)
RDW: 15.3 % (ref 11.5–15.5)
WBC: 13.6 10*3/uL — AB (ref 4.0–10.5)

## 2013-07-30 MED ORDER — LEVOFLOXACIN 500 MG PO TABS: 500.0000 mg | ORAL_TABLET | ORAL | Status: DC

## 2013-07-30 MED ORDER — LEVOFLOXACIN 500 MG PO TABS: 500.0000 mg | ORAL_TABLET | ORAL | Status: AC

## 2013-07-30 MED ORDER — MORPHINE SULFATE ER 15 MG PO TBCR: EXTENDED_RELEASE_TABLET | ORAL | Status: AC

## 2013-07-30 MED ORDER — HYDROCODONE-ACETAMINOPHEN 7.5-325 MG PO TABS: 1.0000 | ORAL_TABLET | Freq: Three times a day (TID) | ORAL | Status: AC

## 2013-07-30 MED ORDER — LORAZEPAM 1 MG PO TABS: 1.0000 mg | ORAL_TABLET | Freq: Two times a day (BID) | ORAL | Status: AC | PRN

## 2013-07-30 MED ORDER — CYCLOBENZAPRINE HCL 5 MG PO TABS: 5.0000 mg | ORAL_TABLET | Freq: Three times a day (TID) | ORAL | Status: AC | PRN

## 2013-07-30 NOTE — Progress Notes (Signed)
Patient has a bed at [facility] today. Patient & family aware. Discharge packet given to RN, Merla RichesMary Grace. [facility] called for transport.   Clinical Social Work Department CLINICAL SOCIAL WORK PLACEMENT NOTE 07/30/2013  Patient:  [name],[name]  Account Number:  [id number] Admit date:  07/26/2013  Clinical Social Worker:  Orpah GreekKELLY FOLEY, LCSWA  Date/time:  07/30/2013 02:05 PM  Clinical Social Work is seeking post-discharge placement for this patient at the following level of care:   SKILLED NURSING   (*CSW will update this form in Epic as items are completed)   07/30/2013  Patient/family provided with [facility] Department of Clinical Social Work's list of facilities offering this level of care within the geographic area requested by the patient (or if unable, by the patient's family).  07/30/2013  Patient/family informed of their freedom to choose among providers that offer the needed level of care, that participate in Medicare, Medicaid or managed care program needed by the patient, have an available bed and are willing to accept the patient.  07/30/2013  Patient/family informed of [facility]' ownership interest in [facility], as well as of the fact that they are under no obligation to receive care at this facility.  PASARR submitted to EDS on 07/30/2013 PASARR number received from EDS on 07/30/2013  FL2 transmitted to all facilities in geographic area requested by pt/family on  07/30/2013 FL2 transmitted to all facilities within larger geographic area on   Patient informed that his/her managed care company has contracts with or will negotiate with  certain facilities, including the following:     Patient/family informed of bed offers received:  07/30/2013 [facility] [place] Physician recommends and patient chooses bed at    Patient to be transferred to [facility] [place] on  07/30/2013 Patient to be transferred to facility by  [facility]  The following physician request were entered in Epic:   Additional Comments:   Lincoln MaxinKelly Reshad Saab, LCSW [facility] Clinical Social Worker cell #: [phone]

## 2013-07-30 NOTE — Progress Notes (Signed)
Discharged to [facility], report given to Cristy RN, PIV removed no s/s of infiltration or swelling noted. D/c LLQ colostomy and suprapubic cath intact draining well.
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
## 2013-08-01 LAB — CULTURE, BLOOD (ROUTINE X 2)
CULTURE: NO GROWTH
Culture: NO GROWTH

## 2013-08-03 DIAGNOSIS — Z515 Encounter for palliative care: Secondary | ICD-10-CM

## 2013-08-03 DIAGNOSIS — R531 Weakness: Secondary | ICD-10-CM

## 2013-08-04 NOTE — Consult Note (Signed)
I have reviewed this case with our NP and agree with the Assessment and Plan as stated.  Uthman Mroczkowski L. Chanel Mcadams, MD MBA [facility] at [place] Team Phone: [phone] Pager: [phone]   

## 2013-08-06 ENCOUNTER — Other Ambulatory Visit: Payer: Self-pay | Admitting: Family Medicine

## 2013-08-06 LAB — URINALYSIS, COMPLETE
BILIRUBIN, UR: NEGATIVE
Bacteria: NONE SEEN
Ketone: NEGATIVE
NITRITE: NEGATIVE
Ph: 5 (ref 4.5–8.0)
Protein: 100
Specific Gravity: 1.013 (ref 1.003–1.030)
Squamous Epithelial: NONE SEEN

## 2013-08-08 LAB — URINE CULTURE

## 2013-08-18 DEATH — deceased

## 2013-12-30 IMAGING — CR DG CHEST 1V PORT
2 series · 2 of 2 positions shown · non-contrast
Comparison: 05/02/2012 at 9490 hours

CLINICAL DATA: Central line placement.

PORTABLE CHEST - 1 VIEW

[AP (1 of 2)]
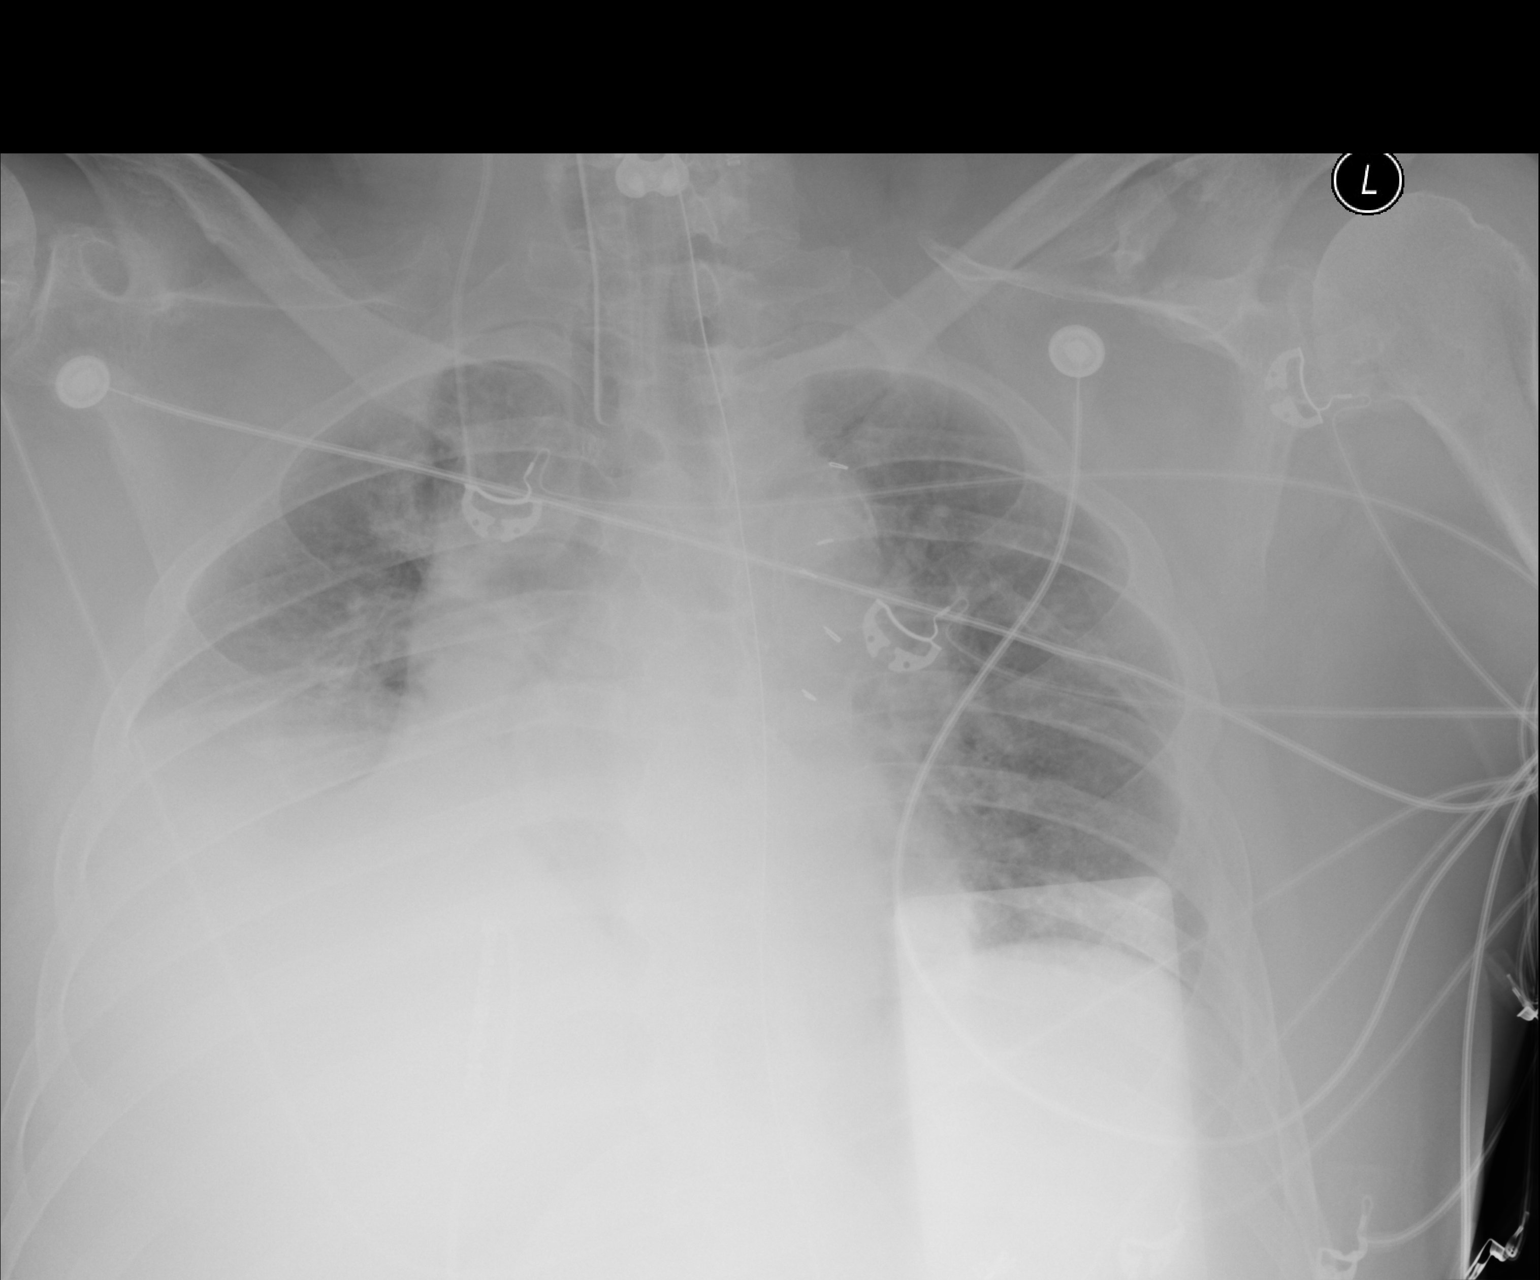

[AP (2 of 2)]
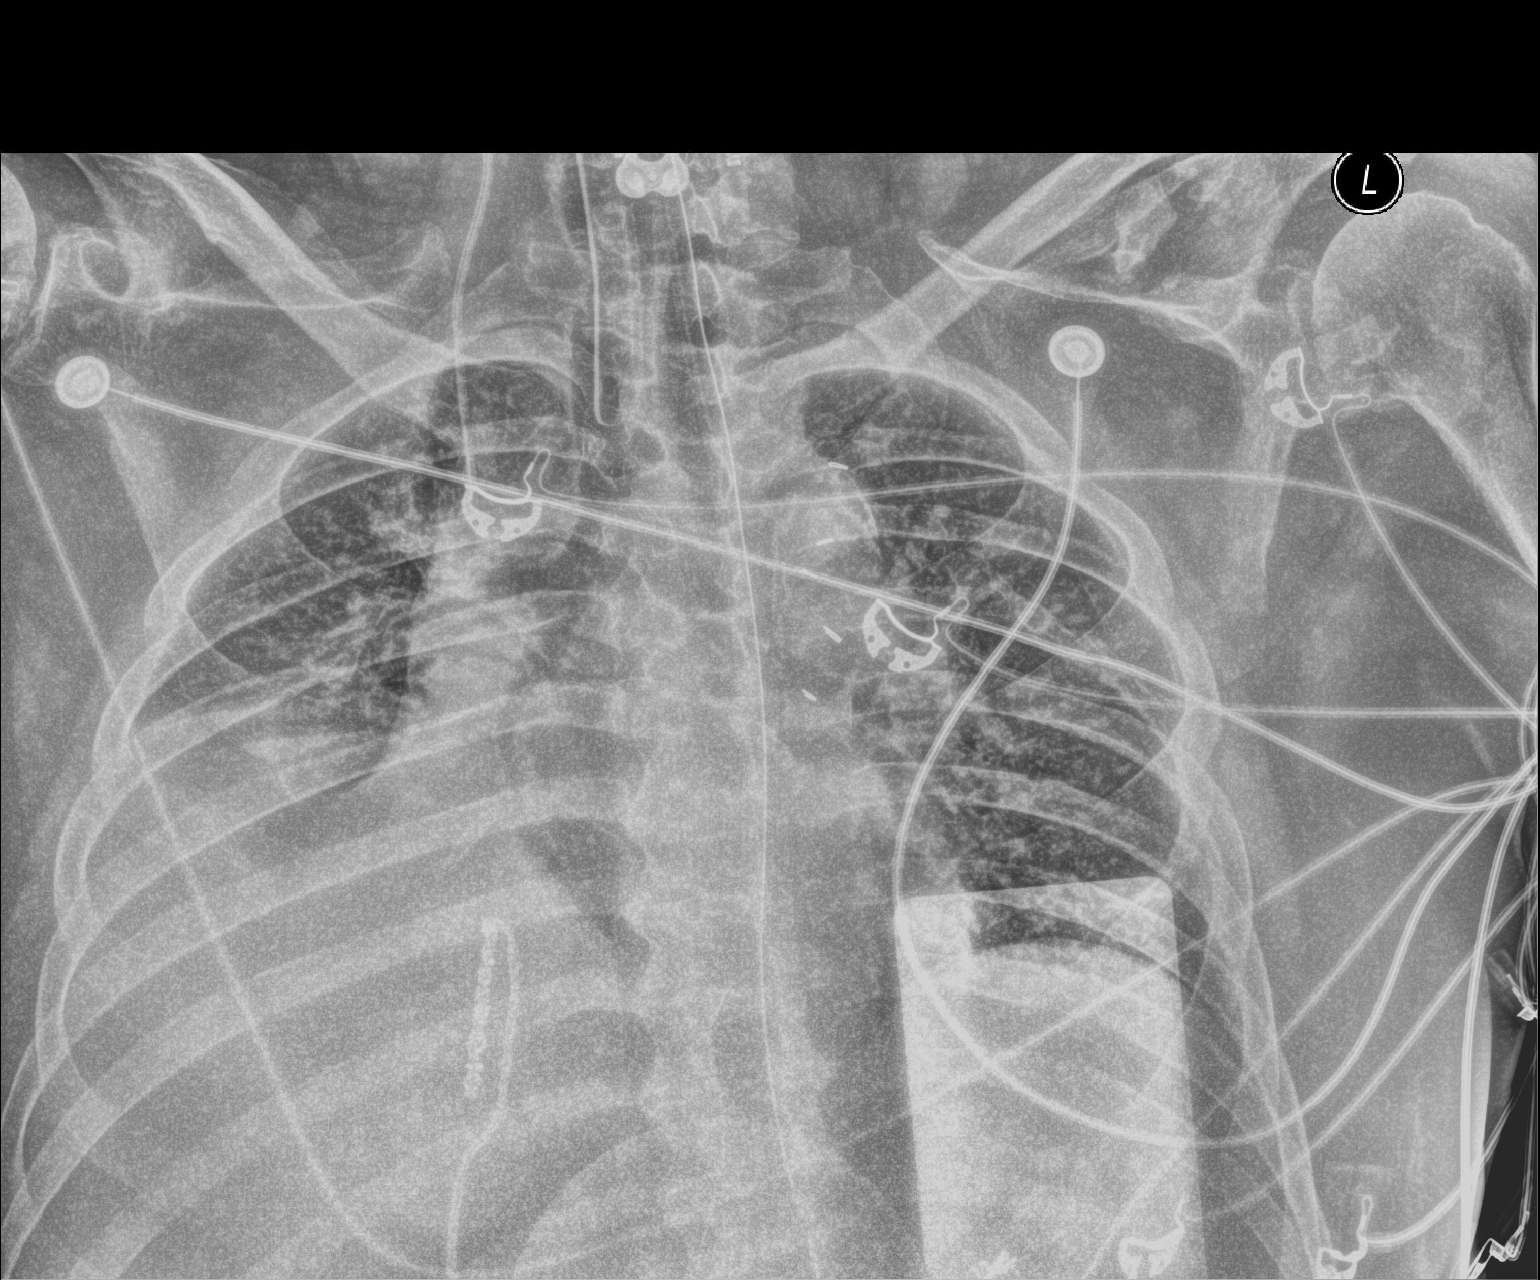

[2 of 2 positions shown; findings below may reference images not displayed]

FINDINGS: Endotracheal and NG tube remain unchanged in position.
Interval placement of a right internal jugular venous catheter with
tip in the upper SVC region.  No pneumothorax.  Cardiac enlargement
with some pulmonary vascular congestion.  Shallow inspiration.
Probable atelectasis and effusion in the right base.
IMPRESSION: Right central venous catheter placed with tip over the upper SVC
region.  No pneumothorax.  Examination is otherwise unchanged.

## 2014-01-02 IMAGING — CR DG CHEST 1V PORT
1 series · 1 of 1 positions shown · non-contrast
Comparison: 05/02/2012

CLINICAL DATA: Cough and congestion

PORTABLE CHEST - 1 VIEW

[AP]
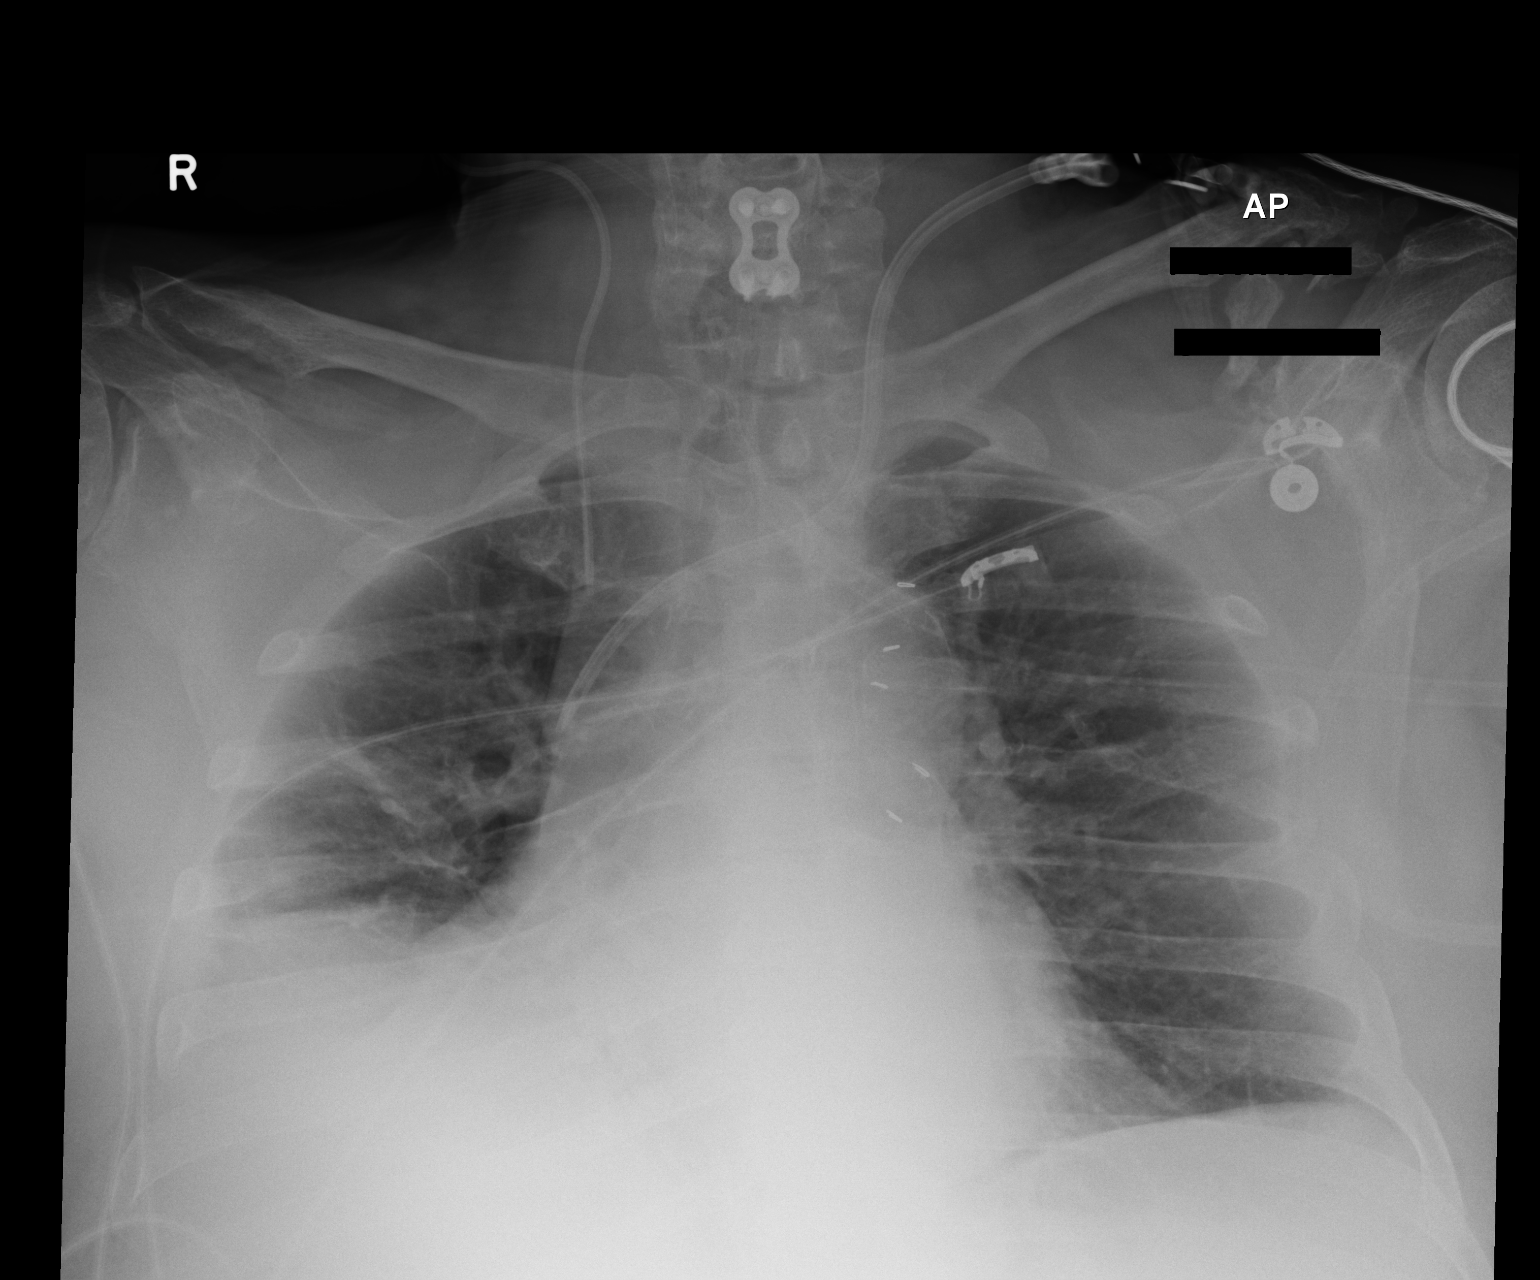

[1 of 1 positions shown; findings below may reference images not displayed]

FINDINGS: Lung volumes remain low.  There is opacity at the right
lung base mostly obscuring the right heart border and right
hemidiaphragm.  This is consistent with right lower and middle lobe
atelectasis.  Infiltrate is not excluded.  There is some mild
discoid atelectasis in the aerated right upper lobe and minor
subsegmental atelectasis or scarring at the left lung base.  No
pulmonary edema.  No pneumothorax.

Left internal jugular dual lumen central venous catheter has its
tip in the mid superior vena cava.  Right internal jugular central
venous line has its tip in the superior aspect of the superior vena
cava.  The nasogastric tube has been removed as has the
endotracheal tube.
IMPRESSION: Right-sided atelectasis involving the right middle and lower lobes.
Infiltrate should be considered in the proper clinical setting.
This is essentially stable..  No pulmonary edema.

Remaining support apparatus is stable well-positioned.
